# Patient Record
Sex: Male | Born: 1960 | Race: Black or African American | Hispanic: No | Marital: Single | Smoking: Former smoker
Health system: Southern US, Community
[De-identification: ages and names within clinical notes are randomized; demographics above are authoritative.]

## PROBLEM LIST (undated history)

## (undated) DIAGNOSIS — M1990 Unspecified osteoarthritis, unspecified site: Secondary | ICD-10-CM

## (undated) DIAGNOSIS — F32A Depression, unspecified: Secondary | ICD-10-CM

## (undated) DIAGNOSIS — J189 Pneumonia, unspecified organism: Secondary | ICD-10-CM

## (undated) DIAGNOSIS — F419 Anxiety disorder, unspecified: Secondary | ICD-10-CM

## (undated) DIAGNOSIS — E119 Type 2 diabetes mellitus without complications: Secondary | ICD-10-CM

## (undated) DIAGNOSIS — I10 Essential (primary) hypertension: Secondary | ICD-10-CM

## (undated) HISTORY — PX: MANDIBLE FRACTURE SURGERY: SHX706

## (undated) HISTORY — PX: HERNIA REPAIR: SHX51

## (undated) HISTORY — DX: Type 2 diabetes mellitus without complications: E11.9

## (undated) HISTORY — DX: Essential (primary) hypertension: I10

---

## 1999-12-17 ENCOUNTER — Emergency Department (HOSPITAL_COMMUNITY): Admission: EM | Admit: 1999-12-17 | Discharge: 1999-12-17 | Payer: Self-pay | Admitting: Emergency Medicine

## 1999-12-26 ENCOUNTER — Emergency Department (HOSPITAL_COMMUNITY): Admission: EM | Admit: 1999-12-26 | Discharge: 1999-12-26 | Payer: Self-pay | Admitting: Emergency Medicine

## 1999-12-26 ENCOUNTER — Encounter: Payer: Self-pay | Admitting: Emergency Medicine

## 1999-12-29 ENCOUNTER — Encounter: Admission: RE | Admit: 1999-12-29 | Discharge: 1999-12-29 | Payer: Self-pay | Admitting: Internal Medicine

## 2000-01-05 ENCOUNTER — Encounter: Admission: RE | Admit: 2000-01-05 | Discharge: 2000-01-05 | Payer: Self-pay | Admitting: Internal Medicine

## 2000-01-06 ENCOUNTER — Emergency Department (HOSPITAL_COMMUNITY): Admission: EM | Admit: 2000-01-06 | Discharge: 2000-01-06 | Payer: Self-pay | Admitting: Emergency Medicine

## 2000-01-08 ENCOUNTER — Ambulatory Visit (HOSPITAL_COMMUNITY): Admission: RE | Admit: 2000-01-08 | Discharge: 2000-01-08 | Payer: Self-pay | Admitting: *Deleted

## 2000-01-09 ENCOUNTER — Encounter: Admission: RE | Admit: 2000-01-09 | Discharge: 2000-01-09 | Payer: Self-pay | Admitting: Internal Medicine

## 2000-02-11 ENCOUNTER — Ambulatory Visit (HOSPITAL_BASED_OUTPATIENT_CLINIC_OR_DEPARTMENT_OTHER): Admission: RE | Admit: 2000-02-11 | Discharge: 2000-02-11 | Payer: Self-pay | Admitting: Surgery

## 2000-02-27 ENCOUNTER — Ambulatory Visit (HOSPITAL_COMMUNITY): Admission: RE | Admit: 2000-02-27 | Discharge: 2000-02-27 | Payer: Self-pay | Admitting: Surgery

## 2000-02-27 ENCOUNTER — Encounter: Payer: Self-pay | Admitting: Surgery

## 2000-07-08 ENCOUNTER — Ambulatory Visit (HOSPITAL_COMMUNITY): Admission: RE | Admit: 2000-07-08 | Discharge: 2000-07-08 | Payer: Self-pay | Admitting: Surgery

## 2000-07-08 ENCOUNTER — Encounter: Payer: Self-pay | Admitting: Surgery

## 2004-11-09 ENCOUNTER — Emergency Department: Payer: Self-pay | Admitting: Emergency Medicine

## 2008-10-01 ENCOUNTER — Inpatient Hospital Stay: Payer: Self-pay | Admitting: Unknown Physician Specialty

## 2010-02-26 ENCOUNTER — Inpatient Hospital Stay: Payer: Self-pay | Admitting: Psychiatry

## 2010-04-26 ENCOUNTER — Emergency Department: Payer: Self-pay | Admitting: Emergency Medicine

## 2011-03-28 ENCOUNTER — Emergency Department: Payer: Self-pay | Admitting: Emergency Medicine

## 2012-11-19 ENCOUNTER — Emergency Department: Payer: Self-pay | Admitting: Emergency Medicine

## 2012-12-27 ENCOUNTER — Emergency Department: Payer: Self-pay | Admitting: Emergency Medicine

## 2012-12-27 LAB — COMPREHENSIVE METABOLIC PANEL
Albumin: 3.4 g/dL (ref 3.4–5.0)
Alkaline Phosphatase: 83 U/L (ref 50–136)
Anion Gap: 4 — ABNORMAL LOW (ref 7–16)
BUN: 10 mg/dL (ref 7–18)
Bilirubin,Total: 0.2 mg/dL (ref 0.2–1.0)
Calcium, Total: 8.6 mg/dL (ref 8.5–10.1)
Chloride: 110 mmol/L — ABNORMAL HIGH (ref 98–107)
Co2: 27 mmol/L (ref 21–32)
Creatinine: 0.86 mg/dL (ref 0.60–1.30)
EGFR (African American): >60
EGFR (Non-African Amer.): >60
Glucose: 102 mg/dL — ABNORMAL HIGH (ref 65–99)
Osmolality: 280 (ref 275–301)
Potassium: 3.9 mmol/L (ref 3.5–5.1)
SGOT(AST): 30 U/L (ref 15–37)
SGPT (ALT): 38 U/L (ref 12–78)
Sodium: 141 mmol/L (ref 136–145)
Total Protein: 7.2 g/dL (ref 6.4–8.2)

## 2012-12-27 LAB — CBC
HCT: 39.5 % — ABNORMAL LOW (ref 40.0–52.0)
HGB: 13.1 g/dL (ref 13.0–18.0)
MCH: 29.6 pg (ref 26.0–34.0)
MCHC: 33.2 g/dL (ref 32.0–36.0)
MCV: 89 fL (ref 80–100)
Platelet: 286 10*3/uL (ref 150–440)
RBC: 4.43 10*6/uL (ref 4.40–5.90)
RDW: 15.1 % — ABNORMAL HIGH (ref 11.5–14.5)
WBC: 6.8 10*3/uL (ref 3.8–10.6)

## 2012-12-27 LAB — URINALYSIS, COMPLETE
Bacteria: NONE SEEN
Bilirubin,UR: NEGATIVE
Blood: NEGATIVE
Glucose,UR: NEGATIVE mg/dL (ref 0–75)
Ketone: NEGATIVE
Leukocyte Esterase: NEGATIVE
Nitrite: NEGATIVE
Ph: 7 (ref 4.5–8.0)
Protein: NEGATIVE
RBC,UR: 2 /HPF (ref 0–5)
Specific Gravity: 1.016 (ref 1.003–1.030)
Squamous Epithelial: NONE SEEN
WBC UR: 1 /HPF (ref 0–5)

## 2013-04-21 ENCOUNTER — Ambulatory Visit: Payer: Self-pay | Admitting: Family Medicine

## 2013-10-10 ENCOUNTER — Emergency Department: Payer: Self-pay | Admitting: Emergency Medicine

## 2016-05-11 ENCOUNTER — Emergency Department: Payer: Self-pay

## 2016-05-11 ENCOUNTER — Emergency Department
Admission: EM | Admit: 2016-05-11 | Discharge: 2016-05-11 | Discharge status: Against Medical Advice | Payer: Self-pay | Attending: Emergency Medicine | Admitting: Emergency Medicine

## 2016-05-11 ENCOUNTER — Encounter: Payer: Self-pay | Admitting: Emergency Medicine

## 2016-05-11 DIAGNOSIS — R109 Unspecified abdominal pain: Secondary | ICD-10-CM

## 2016-05-11 DIAGNOSIS — R1030 Lower abdominal pain, unspecified: Secondary | ICD-10-CM | POA: Insufficient documentation

## 2016-05-11 DIAGNOSIS — F17210 Nicotine dependence, cigarettes, uncomplicated: Secondary | ICD-10-CM | POA: Insufficient documentation

## 2016-05-11 DIAGNOSIS — R059 Cough, unspecified: Secondary | ICD-10-CM

## 2016-05-11 DIAGNOSIS — R05 Cough: Secondary | ICD-10-CM

## 2016-05-11 DIAGNOSIS — R079 Chest pain, unspecified: Secondary | ICD-10-CM | POA: Insufficient documentation

## 2016-05-11 LAB — TROPONIN I

## 2016-05-11 LAB — BASIC METABOLIC PANEL
ANION GAP: 6 (ref 5–15)
BUN: 13 mg/dL (ref 6–20)
CALCIUM: 8.7 mg/dL — AB (ref 8.9–10.3)
CO2: 25 mmol/L (ref 22–32)
CREATININE: 0.96 mg/dL (ref 0.61–1.24)
Chloride: 107 mmol/L (ref 101–111)
GFR calc Af Amer: >60 mL/min (ref >60)
GFR calc non Af Amer: >60 mL/min (ref >60)
Glucose, Bld: 109 mg/dL — ABNORMAL HIGH (ref 65–99)
Potassium: 4 mmol/L (ref 3.5–5.1)
Sodium: 138 mmol/L (ref 135–145)

## 2016-05-11 LAB — CBC
HCT: 40 % (ref 40.0–52.0)
Hemoglobin: 13.9 g/dL (ref 13.0–18.0)
MCH: 30 pg (ref 26.0–34.0)
MCHC: 34.8 g/dL (ref 32.0–36.0)
MCV: 86.2 fL (ref 80.0–100.0)
PLATELETS: 298 10*3/uL (ref 150–440)
RBC: 4.64 MIL/uL (ref 4.40–5.90)
RDW: 14.8 % — AB (ref 11.5–14.5)
WBC: 6.8 10*3/uL (ref 3.8–10.6)

## 2016-05-11 NOTE — ED Provider Notes (Signed)
[facility] Emergency Department Provider Note   ____________________________________________   First MD Initiated Contact with Patient 05/11/16 1459     (approximate)  I have reviewed the triage vital signs and the nursing notes.   HISTORY  Chief Complaint Cough; Chest Pain; and Abdominal Pain   HPI Calvin Wheeler is a 55 y.o. male  with multiple complaints including left sided chest pain as well as lower abdominal pain. Also complains of a cough.  When I began my shift the nurse asked me to probably going to the room as the patient is now wanting to leave AGAINST MEDICAL ADVICE. Per the patient's nurse he was here several weeks ago and "nothing was found." He says that he would like to go at this time because he does not feel that he is getting any useful information.  History reviewed. No pertinent past medical history.  There are no active problems to display for this patient.   Past Surgical History:  Procedure Laterality Date  . HERNIA REPAIR      Prior to Admission medications   Not on File    Allergies Review of patient's allergies indicates no known allergies.  No family history on file.  Social History Social History  Substance Use Topics  . Smoking status: Current Some Day Smoker    Packs/day: 0.50    Types: Cigarettes  . Smokeless tobacco: Never Used  . Alcohol use No    Review of Systems  Unable to perform review of systems secondary to patient wanting to leave and not willing to participate in this portion of the history.  ____________________________________________   PHYSICAL EXAM:  VITAL SIGNS: ED Triage Vitals  Enc Vitals Group     BP 05/11/16 1124 134/86     Pulse Rate 05/11/16 1124 63     Resp 05/11/16 1124 18     Temp 05/11/16 1124 98.1 F (36.7 C)     Temp Source 05/11/16 1124 Oral     SpO2 05/11/16 1124 96 %     Weight 05/11/16 1124 275 lb (124.7 kg)     Height 05/11/16 1124 6\' 2"  (1.88 m)   Head Circumference --      Peak Flow --      Pain Score 05/11/16 1138 8     Pain Loc --      Pain Edu? --      Excl. in GC? --     Constitutional: Alert and oriented. Well appearing and in no acute distress. Head: Atraumatic. Nose: No congestion/rhinnorhea. Neck: No stridor.   Respiratory: Normal respiratory effort.  Speaks in full sentences Neurologic:  Normal speech and language. No gross focal neurologic deficits are appreciated. No gait instability.  Physical exam based on gross observation secondary to patient wanted to leave and I wanted to straighten any further medical workup.  ____________________________________________   LABS (all labs ordered are listed, but only abnormal results are displayed)  Labs Reviewed  BASIC METABOLIC PANEL - Abnormal; Notable for the following:       Result Value   Glucose, Bld 109 (*)    Calcium 8.7 (*)    All other components within normal limits  CBC - Abnormal; Notable for the following:    RDW 14.8 (*)    All other components within normal limits  TROPONIN I   ____________________________________________  EKG  ED ECG REPORT I, Arelia LongestSchaevitz,  Rudy Domek M, the attending physician, personally viewed and interpreted this ECG.   Date: 05/11/2016  EKG Time: 1121  Rate: 65  Rhythm: normal sinus rhythm  Axis: Normal  Intervals:none  ST&T Change: No ST segment elevation or depression. No abnormal T-wave inversions.   ____________________________________________  RADIOLOGY  DG Chest 2 View (Accession [id number]) (Order [id number])  Imaging  Date: 05/11/2016 Department: [facility] EMERGENCY DEPARTMENT Released By: Jodelle Red, RN (auto-released) Authorizing: Jene Every, MD  PACS Images   Show images for DG Chest 2 View  Study Result   CLINICAL DATA:  Productive cough for 3 weeks  EXAM: CHEST  2 VIEW  COMPARISON:  12/27/2012  FINDINGS: Normal heart size. Lungs clear. No pneumothorax. No  pleural effusion.  IMPRESSION: No active cardiopulmonary disease.   Electronically Signed   By: Jolaine Click M.D.   On: 05/11/2016 12:30     ____________________________________________   PROCEDURES  Procedure(s) performed:   Procedures  Critical Care performed:   ____________________________________________   INITIAL IMPRESSION / ASSESSMENT AND PLAN / ED COURSE  Pertinent labs & imaging results that were available during my care of the patient were reviewed by me and considered in my medical decision making (see chart for details).   Clinical Course   Patient does not appear clinically intoxicated. He was able to ambulate without any assistance. I offered to complete his exam and evaluation but he refused. He says that he would much rather leave. Because of his refusal to continue with his care I will be discharging him AGAINST MEDICAL ADVICE. He did have a very reassuring lab workup. However, without any clinical context is very difficult to make any definitive diagnosis. I gave him follow-up with cardiology as well as surgery and primary care. He knows that he may return at any time for further workup and treatment.  ____________________________________________   FINAL CLINICAL IMPRESSION(S) / ED DIAGNOSES  Final diagnoses:  Cough  Chest pain, unspecified chest pain type  Abdominal pain, unspecified abdominal location      NEW MEDICATIONS STARTED DURING THIS VISIT:  New Prescriptions   No medications on file     Note:  This document was prepared using Dragon voice recognition software and may include unintentional dictation errors.    Myrna Blazer, MD 05/11/16 [phone]

## 2016-05-11 NOTE — ED Notes (Signed)
Patient standing at room door shouting to the nurses and doctors station "can I get my papers" I told patient I am waiting on his papers. When this RN stepped in the room patient stated "Do not tell me what I need to do. I can leave" This RN told patient "I had to wait on your papers to print. If you would like to wait and sign, I am bringing it up now"  Patient stayed to sign and stated "You do not have to worry about me coming back. If I come back it will be by ambulance"   Patient grabbed papers and threw them in the trash as he walked out

## 2016-05-11 NOTE — ED Notes (Signed)
Patient verbally aggressive when speaking to this RN. Patient stated "If you are just going to give me the run around and not tell me what is wrong then I am going to leave" This RN told patient I will speak with the doctor and see if your results are back

## 2016-05-11 NOTE — ED Triage Notes (Signed)
Pt states he has had a productive cough for about a week now, also c/o left sided cp intermittently for a week also. Has been taking cough medicine with no relief, denies heart issues, pain in chest comes while resting and while coughing, states, "the ache travels to his left shoulder as well." Pt has had a hernia in the past, states the lower abd pain has been bothering him with cough also. NAD.

## 2016-05-30 NOTE — Progress Notes (Deleted)
    Cardiology Office Note   Date:  05/30/2016   ID:  Calvin Wheeler, DOB 01/21/1961, MRN [id number]  Referring Doctor:  No PCP Per Patient   Cardiologist:   Almond LintAileen Cena Bruhn, MD   Reason for consultation:  No chief complaint on file.     History of Present Illness: Calvin Wheeler is a 55 y.o. male who presents for ***   ROS:  Please see the history of present illness. Aside from mentioned under HPI, all other systems are reviewed and negative.     No past medical history on file.  Past Surgical History:  Procedure Laterality Date  . HERNIA REPAIR       reports that he has been smoking Cigarettes.  He has been smoking about 0.50 packs per day. He has never used smokeless tobacco. He reports that he does not drink alcohol.   family history is not on file.   No outpatient prescriptions prior to visit.   No facility-administered medications prior to visit.      Allergies: Review of patient's allergies indicates no known allergies.    PHYSICAL EXAM: VS:  There were no vitals taken for this visit. , There is no height or weight on file to calculate BMI. Wt Readings from Last 3 Encounters:  05/11/16 275 lb (124.7 kg)    GENERAL:  well developed, well nourished, *** obese, not in acute distress HEENT: normocephalic, pink conjunctivae, anicteric sclerae, no xanthelasma, normal dentition, oropharynx clear NECK:  no neck vein engorgement, JVP normal, no hepatojugular reflux, carotid upstroke brisk and symmetric, no bruit, no thyromegaly, no lymphadenopathy LUNGS:  good respiratory effort, clear to auscultation bilaterally CV:  PMI not displaced, no thrills, no lifts, S1 and S2 within normal limits, no palpable S3 or S4, no murmurs, no rubs, no gallops ABD:  Soft, nontender, nondistended, normoactive bowel sounds, no abdominal aortic bruit, no hepatomegaly, no splenomegaly MS: nontender back, no kyphosis, no scoliosis, no joint deformities EXT:  2+ DP/PT pulses, no edema, no  varicosities, no cyanosis, no clubbing SKIN: warm, nondiaphoretic, normal turgor, no ulcers NEUROPSYCH: alert, oriented to person, place, and time, sensory/motor grossly intact, normal mood, appropriate affect  Recent Labs: 05/11/2016: BUN 13; Creatinine, Ser 0.96; Hemoglobin 13.9; Platelets 298; Potassium 4.0; Sodium 138   Lipid Panel No results found for: CHOL, TRIG, HDL, CHOLHDL, VLDL, LDLCALC, LDLDIRECT   Other studies Reviewed:  EKG:  The ekg from *** was personally reviewed by me and it revealed ***  Additional studies/ records that were reviewed personally reviewed by me today include: ***   ASSESSMENT AND PLAN:    Current medicines are reviewed at length with the patient today.  The patient {ACTIONS; HAS/DOES NOT HAVE:19233} concerns regarding medicines.  Labs/ tests ordered today include: No orders of the defined types were placed in this encounter.   I had a lengthy and detailed discussion with the patient regarding diagnoses, prognosis, diagnostic options, treatment options ***, and side effects of medications.   I counseled the patient on importance of lifestyle modification including heart healthy diet, regular physical activity *** , and smoking cessation.   Disposition:   FU with undersigned after tests ***   Signed, Almond LintAileen [place] Monds, MD  05/30/2016 11:58 AM    [facility]  This note was generated in part with voice recognition software and I apologize for any typographical errors that were not detected and corrected.

## 2016-06-02 ENCOUNTER — Ambulatory Visit: Payer: Self-pay | Admitting: Cardiology

## 2017-11-30 ENCOUNTER — Other Ambulatory Visit: Payer: Self-pay

## 2017-11-30 ENCOUNTER — Emergency Department
Admission: EM | Admit: 2017-11-30 | Discharge: 2017-11-30 | Disposition: A | Discharge status: Home / Self Care | Payer: Self-pay | Attending: Emergency Medicine | Admitting: Emergency Medicine

## 2017-11-30 ENCOUNTER — Emergency Department: Payer: Self-pay

## 2017-11-30 ENCOUNTER — Encounter: Payer: Self-pay | Admitting: Emergency Medicine

## 2017-11-30 DIAGNOSIS — F17210 Nicotine dependence, cigarettes, uncomplicated: Secondary | ICD-10-CM | POA: Insufficient documentation

## 2017-11-30 DIAGNOSIS — J209 Acute bronchitis, unspecified: Secondary | ICD-10-CM | POA: Insufficient documentation

## 2017-11-30 LAB — GROUP A STREP BY PCR: Group A Strep by PCR: NOT DETECTED

## 2017-11-30 LAB — INFLUENZA PANEL BY PCR (TYPE A & B)
INFLAPCR: NEGATIVE
Influenza B By PCR: NEGATIVE

## 2017-11-30 MED ORDER — AZITHROMYCIN 500 MG PO TABS: 500.0000 mg | ORAL_TABLET | Freq: Every day | ORAL | 0 refills | Status: AC

## 2017-11-30 MED ORDER — HYDROCOD POLST-CPM POLST ER 10-8 MG/5ML PO SUER
5.0000 mL | Freq: Once | ORAL | Status: AC
  Administered 2017-11-30: 5 mL via ORAL
  Filled 2017-11-30: qty 5

## 2017-11-30 MED ORDER — BENZONATATE 100 MG PO CAPS: 100.0000 mg | ORAL_CAPSULE | Freq: Three times a day (TID) | ORAL | 0 refills | Status: AC | PRN

## 2017-11-30 NOTE — ED Notes (Addendum)
Pt to the er for cough and sore throat for 2 weeks. Pt has been taking otc meds to try and help. Pt coughing up white phlegm.

## 2017-11-30 NOTE — ED Provider Notes (Signed)
[facility] Emergency Department Provider Note    First MD Initiated Contact with Patient 11/30/17 [phone]     (approximate)  I have reviewed the triage vital signs and the nursing notes.   HISTORY  Chief Complaint Sore Throat    HPI Calvin Wheeler is a 57 y.o. male with no history of any chronic medical conditions presents to the emergency department with a 2-week history of sore throat nasal congestion and nonproductive cough.  Patient denies any fever.  Patient denies multiple sick contacts with same.   Past medical history No chronic medical conditions There are no active problems to display for this patient.   Past Surgical History:  Procedure Laterality Date  . HERNIA REPAIR      Prior to Admission medications   Not on File    Allergies No known drug allergies No family history on file.  Social History Social History   Tobacco Use  . Smoking status: Current Some Day Smoker    Packs/day: 0.50    Types: Cigarettes  . Smokeless tobacco: Never Used  Substance Use Topics  . Alcohol use: No  . Drug use: Not on file    Review of Systems Constitutional: No fever/chills Eyes: No visual changes. ENT: Positive for sore throat.  Positive for nasal congestion Cardiovascular: Denies chest pain. Respiratory: Denies shortness of breath.  Positive for cough Gastrointestinal: No abdominal pain.  No nausea, no vomiting.  No diarrhea.  No constipation. Genitourinary: Negative for dysuria. Musculoskeletal: Negative for neck pain.  Negative for back pain. Integumentary: Negative for rash. Neurological: Negative for headaches, focal weakness or numbness.  ____________________________________________   PHYSICAL EXAM:  VITAL SIGNS: ED Triage Vitals  Enc Vitals Group     BP 11/30/17 0314 (!) 142/86     Pulse Rate 11/30/17 0314 90     Resp 11/30/17 0314 18     Temp 11/30/17 0314 99.8 F (37.7 C)     Temp Source 11/30/17 0314 Oral   SpO2 11/30/17 0314 97 %     Weight 11/30/17 0310 117.9 kg (260 lb)     Height 11/30/17 0310 1.88 m (6\' 2" )     Head Circumference --      Peak Flow --      Pain Score 11/30/17 0310 10     Pain Loc --      Pain Edu? --      Excl. in GC? --    Constitutional: Alert and oriented. Well appearing and in no acute distress. Eyes: Conjunctivae are normal.  Head: Atraumatic. Nose: Positive for congestion/rhinnorhea. Mouth/Throat: Mucous membranes are moist.  Oropharynx non-erythematous. Neck: No stridor.   Cardiovascular: Normal rate, regular rhythm. Good peripheral circulation. Grossly normal heart sounds. Respiratory: Normal respiratory effort.  No retractions. Lungs CTAB. Gastrointestinal: Soft and nontender. No distention.  Musculoskeletal: No lower extremity tenderness nor edema. No gross deformities of extremities. Neurologic:  Normal speech and language. No gross focal neurologic deficits are appreciated.  Skin:  Skin is warm, dry and intact. No rash noted. Psychiatric: Mood and affect are normal. Speech and behavior are normal.  ____________________________________________   LABS (all labs ordered are listed, but only abnormal results are displayed)  Labs Reviewed  GROUP A STREP BY PCR  INFLUENZA PANEL BY PCR (TYPE A & B)    RADIOLOGY I, [place] BROWN, personally viewed and evaluated these images (plain radiographs) as part of my medical decision making, as well as reviewing the written report by the  radiologist.  ED MD interpretation: No acute findings noted on chest x-ray  Official radiology report(s): Dg Chest 2 View  Result Date: 11/30/2017 CLINICAL DATA:  57 y/o  M; 2 weeks of cough and sore throat. EXAM: CHEST  2 VIEW COMPARISON:  05/11/2016 chest radiograph FINDINGS: Stable heart size and mediastinal contours are within normal limits. Both lungs are clear. The visualized skeletal structures are unremarkable. IMPRESSION: No acute pulmonary process identified.  Electronically Signed   By: Mitzi Hansen M.D.   On: 11/30/2017 03:44    Procedures   ____________________________________________   INITIAL IMPRESSION / ASSESSMENT AND PLAN / ED COURSE  As part of my medical decision making, I reviewed the following data within the electronic MEDICAL RECORD NUMBER  57 year old male presented with above-stated history and physical exam secondary to cough congestion.  Given chronicity of symptoms considered possibility of bronchitis, pneumonia influenza or strep.  Influenza PCR negative strep PCR likewise negative chest x-ray revealed no evidence of as such suspect bronchitis as the etiology for the patient's symptoms.  Patient given azithromycin in the emergency department and will be prescribed the same for home as well as Tessalon Perles.    ____________________________________________  FINAL CLINICAL IMPRESSION(S) / ED DIAGNOSES  Final diagnoses:  Acute bronchitis, unspecified organism     MEDICATIONS GIVEN DURING THIS VISIT:  Medications  chlorpheniramine-HYDROcodone (TUSSIONEX) 10-8 MG/5ML suspension 5 mL (5 mLs Oral Given 11/30/17 0345)     ED Discharge Orders    None       Note:  This document was prepared using Dragon voice recognition software and may include unintentional dictation errors.    Darci Current, MD 11/30/17 2242

## 2017-11-30 NOTE — ED Triage Notes (Signed)
Patient ambulatory to triage with steady gait, without difficulty or distress noted, mask in place; pt st x 2wks having sore throat and congestion; denies fever

## 2017-12-01 ENCOUNTER — Other Ambulatory Visit: Payer: Self-pay

## 2017-12-01 ENCOUNTER — Emergency Department
Admission: EM | Admit: 2017-12-01 | Discharge: 2017-12-01 | Disposition: A | Discharge status: Home / Self Care | Payer: Self-pay | Attending: Emergency Medicine | Admitting: Emergency Medicine

## 2017-12-01 DIAGNOSIS — F17210 Nicotine dependence, cigarettes, uncomplicated: Secondary | ICD-10-CM | POA: Insufficient documentation

## 2017-12-01 DIAGNOSIS — J40 Bronchitis, not specified as acute or chronic: Secondary | ICD-10-CM | POA: Insufficient documentation

## 2017-12-01 NOTE — ED Triage Notes (Signed)
Pt was seen here yesterday and dx with bronchitis was given antibiotics and cough med. States the cough med is making him drowsy and cannot go back to work tomorrow as scheduled. States he is here for an extension on his work note.

## 2017-12-01 NOTE — ED Notes (Signed)
Pt requesting a note for work.  Pt was seen here yesterday for bronchitis.  Pt alert.

## 2017-12-01 NOTE — ED Provider Notes (Signed)
[facility] Emergency Department Provider Note  ____________________________________________  Time seen: Approximately 9:05 PM  I have reviewed the triage vital signs and the nursing notes.   HISTORY  Chief Complaint Medication Reaction    HPI Calvin Wheeler is a 57 y.o. male who presents the emergency department requesting extension of his work note.  Patient was seen in this department yesterday and diagnosed with bronchitis and started on Tessalon Perles and Zithromax.  Patient reports that the [employer] Per[employer] worked exceptionally well for his cough but it caused him to be jittery, and drowsy.  Patient works around [employer] and is requesting another day off of work prior to returning.  At this time, patient reports that symptoms have improved somewhat with the medication but he still does not "feel myself."  Patient denies any fevers or chills, headache, visual changes, chest pain, shortness of breath, abdominal pain, nausea or vomiting.  Patient still has coughing.  No other complaints at this time.  Patient is taking his prescribed medications of Zithromax and Tessalon Perles  No past medical history on file.  There are no active problems to display for this patient.   Past Surgical History:  Procedure Laterality Date  . HERNIA REPAIR      Prior to Admission medications   Medication Sig Start Date End Date Taking? Authorizing Provider  azithromycin (ZITHROMAX) 500 MG tablet Take 1 tablet (500 mg total) by mouth daily for 3 days. Take 1 tablet daily for 3 days. 11/30/17 12/03/17  Darci CurrentBrown, Martin N, MD  benzonatate (TESSALON PERLES) 100 MG capsule Take 1 capsule (100 mg total) by mouth 3 (three) times daily as needed for up to 10 days for cough. 11/30/17 12/10/17  Darci CurrentBrown, Kake N, MD    Allergies Patient has no known allergies.  No family history on file.  Social History Social History   Tobacco Use  . Smoking status: Current Some Day Smoker   Packs/day: 0.50    Types: Cigarettes  . Smokeless tobacco: Never Used  Substance Use Topics  . Alcohol use: No  . Drug use: Not on file     Review of Systems  Constitutional: No fever/chills Eyes: No visual changes. No discharge ENT: No upper respiratory complaints. Cardiovascular: no chest pain. Respiratory: Positive cough. No SOB. Gastrointestinal: No abdominal pain.  No nausea, no vomiting.  Musculoskeletal: Negative for musculoskeletal pain. Skin: Negative for rash, abrasions, lacerations, ecchymosis. Neurological: Negative for headaches, focal weakness or numbness. 10-point ROS otherwise negative.  ____________________________________________   PHYSICAL EXAM:  VITAL SIGNS: ED Triage Vitals  Enc Vitals Group     BP 12/01/17 2053 134/84     Pulse Rate 12/01/17 2053 91     Resp 12/01/17 2053 20     Temp 12/01/17 2053 98.6 F (37 C)     Temp Source 12/01/17 2053 Oral     SpO2 12/01/17 2053 96 %     Weight 12/01/17 2054 260 lb (117.9 kg)     Height 12/01/17 2054 6\' 2"  (1.88 m)     Head Circumference --      Peak Flow --      Pain Score --      Pain Loc --      Pain Edu? --      Excl. in GC? --      Constitutional: Alert and oriented. Well appearing and in no acute distress. Eyes: Conjunctivae are normal. PERRL. EOMI. Head: Atraumatic. ENT:      Ears: EACs and  TMs unremarkable bilaterally.      Nose: No congestion/rhinnorhea.      Mouth/Throat: Mucous membranes are moist.  Oropharynx is nonerythematous and nonedematous. Neck: No stridor.    Cardiovascular: Normal rate, regular rhythm. Normal S1 and S2.  Good peripheral circulation. Respiratory: Normal respiratory effort without tachypnea or retractions. Lungs CTAB. Good air entry to the bases with no decreased or absent breath sounds. Musculoskeletal: Full range of motion to all extremities. No gross deformities appreciated. Neurologic:  Normal speech and language. No gross focal neurologic deficits are  appreciated.  Skin:  Skin is warm, dry and intact. No rash noted. Psychiatric: Mood and affect are normal. Speech and behavior are normal. Patient exhibits appropriate insight and judgement.   ____________________________________________   LABS (all labs ordered are listed, but only abnormal results are displayed)  Labs Reviewed - No data to display ____________________________________________  EKG   ____________________________________________  RADIOLOGY  Imaging from 11/30/2017 is reviewed.  No acute findings on chest x-ray.  Consistent with radiologist finding.  No results found.  ____________________________________________    PROCEDURES  Procedure(s) performed:    Procedures    Medications - No data to display   ____________________________________________   INITIAL IMPRESSION / ASSESSMENT AND PLAN / ED COURSE  Pertinent labs & imaging results that were available during my care of the patient were reviewed by me and considered in my medical decision making (see chart for details).  Review of the [place] CSRS was performed in accordance of the [facility] prior to dispensing any controlled drugs.     Patient's diagnosis is consistent with acute bronchitis.  Patient is here requesting another day off of work.  I will write one further day off work and patient will return.  At this time, patient has reached his maximum days out of work on a work note for the emergency department otherwise, patient will be in [employer]..  Patient is to continue with Zithromax and Tessalon Perles at home.  Tylenol and Motrin as needed.  Patient is to rest, drink plenty of fluids.  Patient is to follow-up with primary care.  Patient is given ED precautions to return to the ED for any worsening or new symptoms.     ____________________________________________  FINAL CLINICAL IMPRESSION(S) / ED DIAGNOSES  Final diagnoses:  Bronchitis      NEW MEDICATIONS STARTED DURING THIS VISIT:  ED  Discharge Orders    None          This chart was dictated using voice recognition software/Dragon. Despite best efforts to proofread, errors can occur which can change the meaning. Any change was purely unintentional.    Lanette Hampshire 12/01/17 2109    Phineas Semen, MD 12/01/17 [phone]

## 2018-01-24 ENCOUNTER — Emergency Department
Admission: EM | Admit: 2018-01-24 | Discharge: 2018-01-24 | Disposition: A | Discharge status: Home / Self Care | Payer: Self-pay | Attending: Emergency Medicine | Admitting: Emergency Medicine

## 2018-01-24 ENCOUNTER — Encounter: Payer: Self-pay | Admitting: Emergency Medicine

## 2018-01-24 DIAGNOSIS — N529 Male erectile dysfunction, unspecified: Secondary | ICD-10-CM | POA: Insufficient documentation

## 2018-01-24 DIAGNOSIS — A630 Anogenital (venereal) warts: Secondary | ICD-10-CM | POA: Insufficient documentation

## 2018-01-24 DIAGNOSIS — F17210 Nicotine dependence, cigarettes, uncomplicated: Secondary | ICD-10-CM | POA: Insufficient documentation

## 2018-01-24 DIAGNOSIS — R350 Frequency of micturition: Secondary | ICD-10-CM | POA: Insufficient documentation

## 2018-01-24 LAB — URINALYSIS, COMPLETE (UACMP) WITH MICROSCOPIC
BILIRUBIN URINE: NEGATIVE
GLUCOSE, UA: NEGATIVE mg/dL
Hgb urine dipstick: NEGATIVE
Ketones, ur: NEGATIVE mg/dL
Leukocytes, UA: NEGATIVE
NITRITE: NEGATIVE
PH: 5 (ref 5.0–8.0)
Protein, ur: NEGATIVE mg/dL
SPECIFIC GRAVITY, URINE: 1.016 (ref 1.005–1.030)

## 2018-01-24 LAB — GLUCOSE, CAPILLARY: Glucose-Capillary: 98 mg/dL (ref 65–99)

## 2018-01-24 NOTE — ED Triage Notes (Addendum)
Pt reports urinary frequency and incontinence during the day, reports difficulty urinating when he tries to. Reports increased thirst. Pt also reports some erectile dysfunction.

## 2018-01-24 NOTE — ED Provider Notes (Signed)
[facility] Emergency Department Provider Note   ____________________________________________   First MD Initiated Contact with Patient 01/24/18 [phone]     (approximate)  I have reviewed the triage vital signs and the nursing notes.   HISTORY  Chief Complaint Urinary Frequency    HPI Calvin Wheeler is a 57 y.o. male patient complained of urinary frequency and intermittent incontinence during the day for 2 weeks.  Patient also complained of erectile dysfunction and papular lesions distal end of the penis.  Patient denies dysuria or urethral discharge.  No palliative measure for complaint.  Patient is requesting urology consult.   History reviewed. No pertinent past medical history.  There are no active problems to display for this patient.   Past Surgical History:  Procedure Laterality Date  . HERNIA REPAIR      Prior to Admission medications   Not on File    Allergies Patient has no known allergies.  No family history on file.  Social History Social History   Tobacco Use  . Smoking status: Current Some Day Smoker    Packs/day: 0.50    Types: Cigarettes  . Smokeless tobacco: Never Used  Substance Use Topics  . Alcohol use: No  . Drug use: Not on file    Review of Systems Constitutional: No fever/chills Eyes: No visual changes. ENT: No sore throat. Cardiovascular: Denies chest pain. Respiratory: Denies shortness of breath. Gastrointestinal: No abdominal pain.  No nausea, no vomiting.  No diarrhea.  No constipation. Genitourinary: Positive urinary frequency and intermittent incontinence.  Erectile dysfunction.  Lesion on penis. Musculoskeletal: Negative for back pain. Skin: Negative for rash. Neurological: Negative for headaches, focal weakness or numbness.   ____________________________________________   PHYSICAL EXAM:  VITAL SIGNS: ED Triage Vitals [01/24/18 0724]  Enc Vitals Group     BP 133/87     Pulse Rate 62   Resp 16     Temp 98.5 F (36.9 C)     Temp Source Oral     SpO2 98 %     Weight 260 lb (117.9 kg)     Height 6\' 2"  (1.88 m)     Head Circumference      Peak Flow      Pain Score 0     Pain Loc      Pain Edu?      Excl. in GC?    Constitutional: Alert and oriented. Well appearing and in no acute distress. Hematological/Lymphatic/Immunilogical: No cervical lymphadenopathy. Cardiovascular: Normal rate, regular rhythm. Grossly normal heart sounds.  Good peripheral circulation. Respiratory: Normal respiratory effort.  No retractions. Lungs CTAB. Gastrointestinal: Soft and nontender. No distention. No abdominal bruits. No CVA tenderness. Genitourinary: Papular lesions distal penis.  No inguinal adenopathy.  No urethral drainage. Skin:  Skin is warm, dry and intact. No rash noted. Psychiatric: Mood and affect are normal. Speech and behavior are normal.  ____________________________________________   LABS (all labs ordered are listed, but only abnormal results are displayed)  Labs Reviewed  URINALYSIS, COMPLETE (UACMP) WITH MICROSCOPIC - Abnormal; Notable for the following components:      Result Value   Color, Urine YELLOW (*)    APPearance CLEAR (*)    All other components within normal limits  GLUCOSE, CAPILLARY   ____________________________________________  EKG   ____________________________________________  RADIOLOGY  ED MD interpretation:    Official radiology report(s): No results found.  ____________________________________________   PROCEDURES  Procedure(s) performed:   Procedures  Critical Care performed: No  ____________________________________________  INITIAL IMPRESSION / ASSESSMENT AND PLAN / ED COURSE  As part of my medical decision making, I reviewed the following data within the electronic MEDICAL RECORD NUMBER    Patient complained of urinary frequency and intermittent incontinence for 2 weeks.  Patient also complained a result dysfunction.   Patient has bilateral warts on shaft of penis.  Patient referred to urology for definitive evaluation and treatment of complaints.  Patient given discharge care instructions to avoid sexual activity until seen by urologist.      ____________________________________________   FINAL CLINICAL IMPRESSION(S) / ED DIAGNOSES  Final diagnoses:  Urinary frequency  Genital warts  Erectile dysfunction, unspecified erectile dysfunction type     ED Discharge Orders    None       Note:  This document was prepared using Dragon voice recognition software and may include unintentional dictation errors.    Joni ReiningSmith, Samrat Hayward K, PA-C 01/24/18 [phone]    Arnaldo NatalMalinda, Paul F, MD 01/24/18 [phone]

## 2018-02-24 ENCOUNTER — Ambulatory Visit: Payer: Self-pay | Admitting: Urology

## 2018-03-08 ENCOUNTER — Other Ambulatory Visit: Payer: Self-pay

## 2018-03-08 ENCOUNTER — Encounter: Payer: Self-pay | Admitting: Emergency Medicine

## 2018-03-08 ENCOUNTER — Emergency Department
Admission: EM | Admit: 2018-03-08 | Discharge: 2018-03-08 | Disposition: A | Discharge status: Home / Self Care | Payer: Self-pay | Attending: Emergency Medicine | Admitting: Emergency Medicine

## 2018-03-08 DIAGNOSIS — R1030 Lower abdominal pain, unspecified: Secondary | ICD-10-CM | POA: Insufficient documentation

## 2018-03-08 DIAGNOSIS — R102 Pelvic and perineal pain: Secondary | ICD-10-CM

## 2018-03-08 DIAGNOSIS — R197 Diarrhea, unspecified: Secondary | ICD-10-CM

## 2018-03-08 DIAGNOSIS — F17210 Nicotine dependence, cigarettes, uncomplicated: Secondary | ICD-10-CM | POA: Insufficient documentation

## 2018-03-08 DIAGNOSIS — R112 Nausea with vomiting, unspecified: Secondary | ICD-10-CM | POA: Insufficient documentation

## 2018-03-08 LAB — URINALYSIS, COMPLETE (UACMP) WITH MICROSCOPIC
BACTERIA UA: NONE SEEN
BILIRUBIN URINE: NEGATIVE
Glucose, UA: NEGATIVE mg/dL
HGB URINE DIPSTICK: NEGATIVE
KETONES UR: NEGATIVE mg/dL
NITRITE: NEGATIVE
PH: 6 (ref 5.0–8.0)
Protein, ur: NEGATIVE mg/dL
SPECIFIC GRAVITY, URINE: 1.018 (ref 1.005–1.030)

## 2018-03-08 LAB — COMPREHENSIVE METABOLIC PANEL
ALT: 20 U/L (ref 17–63)
AST: 27 U/L (ref 15–41)
Albumin: 3.1 g/dL — ABNORMAL LOW (ref 3.5–5.0)
Alkaline Phosphatase: 59 U/L (ref 38–126)
Anion gap: 8 (ref 5–15)
BUN: 8 mg/dL (ref 6–20)
CO2: 24 mmol/L (ref 22–32)
Calcium: 9 mg/dL (ref 8.9–10.3)
Chloride: 108 mmol/L (ref 101–111)
Creatinine, Ser: 1.04 mg/dL (ref 0.61–1.24)
Glucose, Bld: 135 mg/dL — ABNORMAL HIGH (ref 65–99)
POTASSIUM: 3.8 mmol/L (ref 3.5–5.1)
Sodium: 140 mmol/L (ref 135–145)
TOTAL PROTEIN: 6.2 g/dL — AB (ref 6.5–8.1)
Total Bilirubin: 0.2 mg/dL — ABNORMAL LOW (ref 0.3–1.2)

## 2018-03-08 LAB — CBC
HEMATOCRIT: 39.7 % — AB (ref 40.0–52.0)
Hemoglobin: 13.3 g/dL (ref 13.0–18.0)
MCH: 29.8 pg (ref 26.0–34.0)
MCHC: 33.6 g/dL (ref 32.0–36.0)
MCV: 88.8 fL (ref 80.0–100.0)
PLATELETS: 273 10*3/uL (ref 150–440)
RBC: 4.47 MIL/uL (ref 4.40–5.90)
RDW: 15.1 % — AB (ref 11.5–14.5)
WBC: 6.2 10*3/uL (ref 3.8–10.6)

## 2018-03-08 LAB — LIPASE, BLOOD: LIPASE: 26 U/L (ref 11–51)

## 2018-03-08 MED ORDER — DICYCLOMINE HCL 10 MG PO CAPS
10.0000 mg | ORAL_CAPSULE | Freq: Once | ORAL | Status: AC
  Administered 2018-03-08: 10 mg via ORAL

## 2018-03-08 MED ORDER — SODIUM CHLORIDE 0.9 % IV BOLUS: 1000.0000 mL | Freq: Once | INTRAVENOUS | Status: DC

## 2018-03-08 MED ORDER — ONDANSETRON HCL 4 MG PO TABS: 4.0000 mg | ORAL_TABLET | Freq: Once | ORAL | Status: DC

## 2018-03-08 MED ORDER — ONDANSETRON HCL 4 MG/2ML IJ SOLN: 4.0000 mg | Freq: Once | INTRAMUSCULAR | Status: DC

## 2018-03-08 MED ORDER — ONDANSETRON HCL 4 MG PO TABS: 4.0000 mg | ORAL_TABLET | Freq: Every day | ORAL | 0 refills | Status: DC | PRN

## 2018-03-08 MED ORDER — DICYCLOMINE HCL 10 MG PO CAPS
ORAL_CAPSULE | ORAL | Status: AC
  Administered 2018-03-08: 10 mg via ORAL
  Filled 2018-03-08: qty 1

## 2018-03-08 MED ORDER — ONDANSETRON 4 MG PO TBDP
4.0000 mg | ORAL_TABLET | Freq: Once | ORAL | Status: AC | PRN
  Administered 2018-03-08: 4 mg via ORAL
  Filled 2018-03-08: qty 1

## 2018-03-08 MED ORDER — DICYCLOMINE HCL 20 MG PO TABS: 20.0000 mg | ORAL_TABLET | Freq: Three times a day (TID) | ORAL | 0 refills | Status: DC | PRN

## 2018-03-08 MED ORDER — ONDANSETRON 4 MG PO TBDP
ORAL_TABLET | ORAL | Status: AC
  Administered 2018-03-08: 4 mg via ORAL
  Filled 2018-03-08: qty 1

## 2018-03-08 MED ORDER — ONDANSETRON 4 MG PO TBDP
4.0000 mg | ORAL_TABLET | Freq: Once | ORAL | Status: AC
  Administered 2018-03-08: 4 mg via ORAL

## 2018-03-08 NOTE — ED Provider Notes (Signed)
[facility] Emergency Department Provider Note  ___________________________________________   First MD Initiated Contact with Patient 03/08/18 1538     (approximate)  I have reviewed the triage vital signs and the nursing notes.   HISTORY  Chief Complaint Nausea and Emesis  HPI Calvin Wheeler is a 57 y.o. male with a history of a hernia repair was presented emergency department 2 days of nausea vomiting and diarrhea.  He states that he is also having some intermittent lower abdominal cramping.  He says that he had multiple episodes of vomiting and diarrhea yesterday and then 2 episodes of vomiting today with about 5 episodes of diarrhea today.  Does not report any recent antibiotics or hospital stays.  Denies any recent sick contacts.  Denies any recent travel.  Had a dose of Zofran in the emergency department waiting room without resolution of his symptoms.  Says he is a chronic cough, likely from smoking, without change.  Does not report fever.   History reviewed. No pertinent past medical history.  There are no active problems to display for this patient.   Past Surgical History:  Procedure Laterality Date  . HERNIA REPAIR      Prior to Admission medications   Not on File    Allergies Patient has no known allergies.  History reviewed. No pertinent family history.  Social History Social History   Tobacco Use  . Smoking status: Current Some Day Smoker    Packs/day: 0.50    Types: Cigarettes  . Smokeless tobacco: Never Used  Substance Use Topics  . Alcohol use: No  . Drug use: Not on file    Review of Systems  Constitutional: No fever/chills Eyes: No visual changes. ENT: No sore throat. Cardiovascular: Denies chest pain. Respiratory: Denies shortness of breath. Gastrointestinal:   No constipation. Genitourinary: Negative for dysuria. Musculoskeletal: Negative for back pain. Skin: Negative for rash. Neurological: Negative for  headaches, focal weakness or numbness.   ____________________________________________   PHYSICAL EXAM:  VITAL SIGNS: ED Triage Vitals  Enc Vitals Group     BP 03/08/18 1406 130/76     Pulse Rate 03/08/18 1406 60     Resp 03/08/18 1406 18     Temp 03/08/18 1406 98.4 F (36.9 C)     Temp Source 03/08/18 1406 Oral     SpO2 03/08/18 1406 97 %     Weight 03/08/18 1407 260 lb (117.9 kg)     Height 03/08/18 1407 6\' 2"  (1.88 m)     Head Circumference --      Peak Flow --      Pain Score 03/08/18 1410 0     Pain Loc --      Pain Edu? --      Excl. in GC? --     Constitutional: Alert and oriented. Well appearing and in no acute distress. Eyes: Conjunctivae are normal.  Head: Atraumatic. Nose: No congestion/rhinnorhea. Mouth/Throat: Mucous membranes are moist.  Neck: No stridor.   Cardiovascular: Normal rate, regular rhythm. Grossly normal heart sounds.   Respiratory: Normal respiratory effort.  No retractions. Lungs CTAB. Gastrointestinal: Soft and nontender. No distention.  Musculoskeletal: No lower extremity tenderness nor edema.  No joint effusions. Neurologic:  Normal speech and language. No gross focal neurologic deficits are appreciated. Skin:  Skin is warm, dry and intact. No rash noted. Psychiatric: Mood and affect are normal. Speech and behavior are normal.  ____________________________________________   LABS (all labs ordered are listed, but only abnormal  results are displayed)  Labs Reviewed  COMPREHENSIVE METABOLIC PANEL - Abnormal; Notable for the following components:      Result Value   Glucose, Bld 135 (*)    Total Protein 6.2 (*)    Albumin 3.1 (*)    Total Bilirubin 0.2 (*)    All other components within normal limits  CBC - Abnormal; Notable for the following components:   HCT 39.7 (*)    RDW 15.1 (*)    All other components within normal limits  URINALYSIS, COMPLETE (UACMP) WITH MICROSCOPIC - Abnormal; Notable for the following components:    Color, Urine YELLOW (*)    APPearance CLEAR (*)    Leukocytes, UA TRACE (*)    All other components within normal limits  URINE CULTURE  LIPASE, BLOOD   ____________________________________________  EKG   ____________________________________________  RADIOLOGY   ____________________________________________   PROCEDURES  Procedure(s) performed:   Procedures  Critical Care performed:   ____________________________________________   INITIAL IMPRESSION / ASSESSMENT AND PLAN / ED COURSE  Pertinent labs & imaging results that were available during my care of the patient were reviewed by me and considered in my medical decision making (see chart for details).  Differential diagnosis includes, but is not limited to, acute appendicitis, renal colic, testicular torsion, urinary tract infection/pyelonephritis, prostatitis,  epididymitis, diverticulitis, small bowel obstruction or ileus, colitis, abdominal aortic aneurysm, gastroenteritis, hernia, etc. As part of my medical decision making, I reviewed the following data within the electronic MEDICAL RECORD NUMBER Notes from prior ED visits  ----------------------------------------- 5:16 PM on 03/08/2018 -----------------------------------------  Patient at this time feels that the symptoms have improved.  Is tolerating p.o. fluids as well as crackers.  Will be discharged with Zofran as well as Bentyl.  Also requesting a work note.  Likely viral etiology.  Very benign abdominal exam and improved with symptomatic treatment.  No urinary complaints.  Will send urine for culture. ____________________________________________   FINAL CLINICAL IMPRESSION(S) / ED DIAGNOSES  Nausea vomiting and diarrhea.  Suprapubic abdominal pain.    NEW MEDICATIONS STARTED DURING THIS VISIT:  New Prescriptions   No medications on file     Note:  This document was prepared using Dragon voice recognition software and may include unintentional  dictation errors.     Myrna BlazerSchaevitz,  Matthew, MD 03/08/18 [phone]

## 2018-03-08 NOTE — ED Triage Notes (Signed)
Pt reports that he developed N/V/D yesterday, he thought that he was feeling better today but once he got to work, he developed the N/V/D again.

## 2018-03-08 NOTE — ED Notes (Signed)
Lab notified to add on Urine Culture. 

## 2018-03-10 LAB — URINE CULTURE

## 2018-03-11 NOTE — Progress Notes (Signed)
ED Antimicrobial Stewardship Positive Culture Follow Up   Calvin Wheeler is an 57 y.o. male who presented to [facility] on 03/08/2018 with a chief complaint of nausea, vomiting and diarrhea for ~2 days. UA not remarkable but urine culture growing >=100,000 colonies E.Coli. Patient was not sent home on any abx.    [x]  Patient discharged originally without antimicrobial agent and treatment is now indicated  New antibiotic prescription: Keflex 500mg  PO twice daily x 7 days   I called patient on both phone numbers on file--busy signal every time I called. Could not reach patient. Does not have pharmacy on file.  ED Provider: Dr. Bayard Malesandolph Brown    Chief Complaint  Patient presents with  . Nausea  . Emesis    Recent Results (from the past 720 hour(s))  Urine Culture     Status: Abnormal   Collection Time: 03/08/18  5:06 PM  Result Value Ref Range Status   Specimen Description   Final    Urine Performed at [facility], [address] [place], [place] [address]    Special Requests   Final    NONE Performed at [facility], [address] [place], [place] [address]    Culture >=100,000 COLONIES/mL ESCHERICHIA COLI (A)  Final   Report Status 03/10/2018 FINAL  Final   Organism ID, Bacteria ESCHERICHIA COLI (A)  Final      Susceptibility   Escherichia coli - MIC*    AMPICILLIN <=2 SENSITIVE Sensitive     CEFAZOLIN <=4 SENSITIVE Sensitive     CEFEPIME <=1 SENSITIVE Sensitive     CEFTAZIDIME <=1 SENSITIVE Sensitive     CEFTRIAXONE <=1 SENSITIVE Sensitive     CIPROFLOXACIN <=0.25 SENSITIVE Sensitive     GENTAMICIN <=1 SENSITIVE Sensitive     IMIPENEM <=0.25 SENSITIVE Sensitive     TRIMETH/SULFA <=20 SENSITIVE Sensitive     AMPICILLIN/SULBACTAM <=2 SENSITIVE Sensitive     PIP/TAZO <=4 SENSITIVE Sensitive     Extended ESBL NEGATIVE Sensitive     * >=100,000 COLONIES/mL ESCHERICHIA COLI     Cleopatra CedarStephanie Aahil Fredin, PharmD Pharmacy Resident  03/11/2018, 1:30 PM

## 2018-03-23 ENCOUNTER — Emergency Department
Admission: EM | Admit: 2018-03-23 | Discharge: 2018-03-23 | Disposition: A | Discharge status: Home / Self Care | Payer: Self-pay | Attending: Student in an Organized Health Care Education/Training Program | Admitting: Student in an Organized Health Care Education/Training Program

## 2018-03-23 ENCOUNTER — Encounter: Payer: Self-pay | Admitting: Emergency Medicine

## 2018-03-23 ENCOUNTER — Other Ambulatory Visit: Payer: Self-pay

## 2018-03-23 DIAGNOSIS — R1013 Epigastric pain: Secondary | ICD-10-CM | POA: Insufficient documentation

## 2018-03-23 DIAGNOSIS — F17210 Nicotine dependence, cigarettes, uncomplicated: Secondary | ICD-10-CM | POA: Insufficient documentation

## 2018-03-23 DIAGNOSIS — R112 Nausea with vomiting, unspecified: Secondary | ICD-10-CM | POA: Insufficient documentation

## 2018-03-23 LAB — URINALYSIS, COMPLETE (UACMP) WITH MICROSCOPIC
BACTERIA UA: NONE SEEN
BILIRUBIN URINE: NEGATIVE
GLUCOSE, UA: NEGATIVE mg/dL
HGB URINE DIPSTICK: NEGATIVE
KETONES UR: NEGATIVE mg/dL
LEUKOCYTES UA: NEGATIVE
NITRITE: NEGATIVE
PROTEIN: NEGATIVE mg/dL
Specific Gravity, Urine: 1.025 (ref 1.005–1.030)
Squamous Epithelial / LPF: NONE SEEN (ref 0–5)
pH: 5 (ref 5.0–8.0)

## 2018-03-23 LAB — CBC
HCT: 41.2 % (ref 40.0–52.0)
HEMOGLOBIN: 13.8 g/dL (ref 13.0–18.0)
MCH: 29.7 pg (ref 26.0–34.0)
MCHC: 33.5 g/dL (ref 32.0–36.0)
MCV: 88.7 fL (ref 80.0–100.0)
Platelets: 267 10*3/uL (ref 150–440)
RBC: 4.64 MIL/uL (ref 4.40–5.90)
RDW: 15.1 % — ABNORMAL HIGH (ref 11.5–14.5)
WBC: 7 10*3/uL (ref 3.8–10.6)

## 2018-03-23 LAB — COMPREHENSIVE METABOLIC PANEL
ALBUMIN: 3.8 g/dL (ref 3.5–5.0)
ALK PHOS: 69 U/L (ref 38–126)
ALT: 18 U/L (ref 17–63)
ANION GAP: 8 (ref 5–15)
AST: 21 U/L (ref 15–41)
BILIRUBIN TOTAL: 0.5 mg/dL (ref 0.3–1.2)
BUN: 12 mg/dL (ref 6–20)
CALCIUM: 8.8 mg/dL — AB (ref 8.9–10.3)
CO2: 24 mmol/L (ref 22–32)
Chloride: 105 mmol/L (ref 101–111)
Creatinine, Ser: 1.03 mg/dL (ref 0.61–1.24)
GFR calc non Af Amer: >60 mL/min (ref >60)
GLUCOSE: 139 mg/dL — AB (ref 65–99)
POTASSIUM: 3.9 mmol/L (ref 3.5–5.1)
SODIUM: 137 mmol/L (ref 135–145)
TOTAL PROTEIN: 7.5 g/dL (ref 6.5–8.1)

## 2018-03-23 LAB — LIPASE, BLOOD: Lipase: 36 U/L (ref 11–51)

## 2018-03-23 MED ORDER — PANTOPRAZOLE SODIUM 40 MG PO TBEC: 40.0000 mg | DELAYED_RELEASE_TABLET | Freq: Every day | ORAL | 1 refills | Status: DC

## 2018-03-23 MED ORDER — GI COCKTAIL ~~LOC~~
30.0000 mL | Freq: Once | ORAL | Status: AC
  Administered 2018-03-23: 30 mL via ORAL

## 2018-03-23 MED ORDER — METOCLOPRAMIDE HCL 10 MG PO TABS: 10.0000 mg | ORAL_TABLET | Freq: Four times a day (QID) | ORAL | 0 refills | Status: DC | PRN

## 2018-03-23 MED ORDER — GI COCKTAIL ~~LOC~~
ORAL | Status: AC
  Filled 2018-03-23: qty 30

## 2018-03-23 MED ORDER — METOCLOPRAMIDE HCL 10 MG PO TABS
10.0000 mg | ORAL_TABLET | Freq: Once | ORAL | Status: AC
  Administered 2018-03-23: 10 mg via ORAL

## 2018-03-23 MED ORDER — METOCLOPRAMIDE HCL 10 MG PO TABS
ORAL_TABLET | ORAL | Status: AC
  Filled 2018-03-23: qty 1

## 2018-03-23 NOTE — Discharge Instructions (Signed)

## 2018-03-23 NOTE — ED Provider Notes (Signed)
[facility] Emergency Department Provider Note    First MD Initiated Contact with Patient 03/23/18 1356     (approximate)  I have reviewed the triage vital signs and the nursing notes.   HISTORY  Chief Complaint Abdominal Pain and Emesis    HPI Calvin Wheeler is a 57 y.o. male presents to the ER with over 1 month of intermittent nausea vomiting epigastric discomfort.  States he frequently feels a "gurgling" in his stomach.  Feels that he has had decreased oral intake.  Is concerned that his been losing weight.  His primary due to decreased appetite.  Patient currently drinking Sprite.  States that he is still audible any sandwiches and simply has not had a desire for those which is concerning him.  Does still smoke.  Denies any chest pain or shortness of breath.  No fevers.  No melena.  No hematochezia.  No hematemesis.  Had one episode of nausea with vomiting today.  Is requesting a work note.    History reviewed. No pertinent past medical history. History reviewed. No pertinent family history. Past Surgical History:  Procedure Laterality Date  . HERNIA REPAIR     There are no active problems to display for this patient.     Prior to Admission medications   Medication Sig Start Date End Date Taking? Authorizing Provider  dicyclomine (BENTYL) 20 MG tablet Take 1 tablet (20 mg total) by mouth 3 (three) times daily as needed for spasms. 03/08/18 03/08/19  Myrna Blazer, MD  metoCLOPramide (REGLAN) 10 MG tablet Take 1 tablet (10 mg total) by mouth every 6 (six) hours as needed for nausea. 03/23/18 03/23/19  Willy Eddy, MD  ondansetron (ZOFRAN) 4 MG tablet Take 1 tablet (4 mg total) by mouth daily as needed. 03/08/18   Myrna Blazer, MD  pantoprazole (PROTONIX) 40 MG tablet Take 1 tablet (40 mg total) by mouth daily. 03/23/18 03/23/19  Willy Eddy, MD    Allergies Patient has no known allergies.    Social History Social  History   Tobacco Use  . Smoking status: Current Some Day Smoker    Packs/day: 0.50    Types: Cigarettes  . Smokeless tobacco: Never Used  Substance Use Topics  . Alcohol use: No  . Drug use: Not on file    Review of Systems Patient denies headaches, rhinorrhea, blurry vision, numbness, shortness of breath, chest pain, edema, cough, abdominal pain, nausea, vomiting, diarrhea, dysuria, fevers, rashes or hallucinations unless otherwise stated above in HPI. ____________________________________________   PHYSICAL EXAM:  VITAL SIGNS: Vitals:   03/23/18 1247 03/23/18 1248  BP:  123/78  Pulse: (!) 55   Resp: 20   Temp: 98.3 F (36.8 C)   SpO2: 96%     Constitutional: Alert and oriented.  Eyes: Conjunctivae are normal.  Head: Atraumatic. Nose: No congestion/rhinnorhea. Mouth/Throat: Mucous membranes are moist.   Neck: No stridor. Painless ROM.  Cardiovascular: Normal rate, regular rhythm. Grossly normal heart sounds.  Good peripheral circulation. Respiratory: Normal respiratory effort.  No retractions. Lungs CTAB. Gastrointestinal: Soft and nontender in all four quadrants. No distention. No abdominal bruits. No CVA tenderness. Genitourinary:  Musculoskeletal: No lower extremity tenderness nor edema.  No joint effusions. Neurologic:  Normal speech and language. No gross focal neurologic deficits are appreciated. No facial droop Skin:  Skin is warm, dry and intact. No rash noted. Psychiatric: Mood and affect are normal. Speech and behavior are normal.  ____________________________________________   LABS (all labs ordered  are listed, but only abnormal results are displayed)  Results for orders placed or performed during the hospital encounter of 03/23/18 (from the past 24 hour(s))  Lipase, blood     Status: None   Collection Time: 03/23/18 12:55 PM  Result Value Ref Range   Lipase 36 11 - 51 U/L  Comprehensive metabolic panel     Status: Abnormal   Collection Time:  03/23/18 12:55 PM  Result Value Ref Range   Sodium 137 135 - 145 mmol/L   Potassium 3.9 3.5 - 5.1 mmol/L   Chloride 105 101 - 111 mmol/L   CO2 24 22 - 32 mmol/L   Glucose, Bld 139 (H) 65 - 99 mg/dL   BUN 12 6 - 20 mg/dL   Creatinine, Ser 1.61 0.61 - 1.24 mg/dL   Calcium 8.8 (L) 8.9 - 10.3 mg/dL   Total Protein 7.5 6.5 - 8.1 g/dL   Albumin 3.8 3.5 - 5.0 g/dL   AST 21 15 - 41 U/L   ALT 18 17 - 63 U/L   Alkaline Phosphatase 69 38 - 126 U/L   Total Bilirubin 0.5 0.3 - 1.2 mg/dL   GFR calc non Af Amer >60 >60 mL/min   GFR calc Af Amer >60 >60 mL/min   Anion gap 8 5 - 15  CBC     Status: Abnormal   Collection Time: 03/23/18 12:55 PM  Result Value Ref Range   WBC 7.0 3.8 - 10.6 K/uL   RBC 4.64 4.40 - 5.90 MIL/uL   Hemoglobin 13.8 13.0 - 18.0 g/dL   HCT 09.6 04.5 - 40.9 %   MCV 88.7 80.0 - 100.0 fL   MCH 29.7 26.0 - 34.0 pg   MCHC 33.5 32.0 - 36.0 g/dL   RDW 81.1 (H) 91.4 - 78.2 %   Platelets 267 150 - 440 K/uL  Urinalysis, Complete w Microscopic     Status: Abnormal   Collection Time: 03/23/18 12:55 PM  Result Value Ref Range   Color, Urine YELLOW (A) YELLOW   APPearance CLEAR (A) CLEAR   Specific Gravity, Urine 1.025 1.005 - 1.030   pH 5.0 5.0 - 8.0   Glucose, UA NEGATIVE NEGATIVE mg/dL   Hgb urine dipstick NEGATIVE NEGATIVE   Bilirubin Urine NEGATIVE NEGATIVE   Ketones, ur NEGATIVE NEGATIVE mg/dL   Protein, ur NEGATIVE NEGATIVE mg/dL   Nitrite NEGATIVE NEGATIVE   Leukocytes, UA NEGATIVE NEGATIVE   RBC / HPF 0-5 0 - 5 RBC/hpf   WBC, UA 0-5 0 - 5 WBC/hpf   Bacteria, UA NONE SEEN NONE SEEN   Squamous Epithelial / LPF NONE SEEN 0 - 5   Mucus PRESENT    ____________________________________________ ____________________________________________   PROCEDURES  Procedure(s) performed:  Procedures    Critical Care performed: no ____________________________________________   INITIAL IMPRESSION / ASSESSMENT AND PLAN / ED COURSE  Pertinent labs & imaging results that  were available during my care of the patient were reviewed by me and considered in my medical decision making (see chart for details).   DDX: Enteritis, gastritis, SBO, cholelithiasis, dehydration, PUD  Calvin Wheeler is a 57 y.o. who presents to the ED with was as described above.  Ongoing for over 1 month.  Patient nontoxic-appearing.  He is afebrile vital signs are stable.  His blood work sent for the above differential is reassuring.  No evidence of blood loss or anemia.  No evidence of AKI or metabolic derangement.  He is tolerating oral hydration.  Symptoms seem somewhat  concerning for gastritis or PUD.  We will start empirically on PPI as well as nausea medication.  Will give work note and of encouraged patient follow-up with PCP.  Will be given referral to GI.  Patient demonstrated understanding of signs and symptoms for which he should seek more urgent evaluation.  At this point I do believe he stable for outpatient treatment.      As part of my medical decision making, I reviewed the following data within the electronic MEDICAL RECORD NUMBER Nursing notes reviewed and incorporated, Labs reviewed, notes from prior ED visits.   ____________________________________________   FINAL CLINICAL IMPRESSION(S) / ED DIAGNOSES  Final diagnoses:  Epigastric pain  Non-intractable vomiting with nausea, unspecified vomiting type      NEW MEDICATIONS STARTED DURING THIS VISIT:  New Prescriptions   METOCLOPRAMIDE (REGLAN) 10 MG TABLET    Take 1 tablet (10 mg total) by mouth every 6 (six) hours as needed for nausea.   PANTOPRAZOLE (PROTONIX) 40 MG TABLET    Take 1 tablet (40 mg total) by mouth daily.     Note:  This document was prepared using Dragon voice recognition software and may include unintentional dictation errors.    Willy Eddyobinson, Dorean Hiebert, MD 03/23/18 1421

## 2018-03-23 NOTE — ED Triage Notes (Signed)
Pt reports that he has abd pain for the beginning of the month. Thought that he was getting better, but abd pain and loss of appetite is getting worse. He states that he is losing weight and is concerned.

## 2018-04-20 ENCOUNTER — Ambulatory Visit: Payer: Self-pay | Admitting: Urology

## 2018-06-16 ENCOUNTER — Other Ambulatory Visit: Payer: Self-pay

## 2018-06-16 ENCOUNTER — Emergency Department
Admission: EM | Admit: 2018-06-16 | Discharge: 2018-06-16 | Disposition: A | Discharge status: Home / Self Care | Payer: 59 | Attending: Emergency Medicine | Admitting: Emergency Medicine

## 2018-06-16 ENCOUNTER — Encounter: Payer: Self-pay | Admitting: Emergency Medicine

## 2018-06-16 DIAGNOSIS — X58XXXA Exposure to other specified factors, initial encounter: Secondary | ICD-10-CM | POA: Diagnosis not present

## 2018-06-16 DIAGNOSIS — Z79899 Other long term (current) drug therapy: Secondary | ICD-10-CM | POA: Insufficient documentation

## 2018-06-16 DIAGNOSIS — Y9389 Activity, other specified: Secondary | ICD-10-CM | POA: Insufficient documentation

## 2018-06-16 DIAGNOSIS — F17210 Nicotine dependence, cigarettes, uncomplicated: Secondary | ICD-10-CM | POA: Diagnosis not present

## 2018-06-16 DIAGNOSIS — Y929 Unspecified place or not applicable: Secondary | ICD-10-CM | POA: Insufficient documentation

## 2018-06-16 DIAGNOSIS — Y999 Unspecified external cause status: Secondary | ICD-10-CM | POA: Diagnosis not present

## 2018-06-16 DIAGNOSIS — S0501XA Injury of conjunctiva and corneal abrasion without foreign body, right eye, initial encounter: Secondary | ICD-10-CM | POA: Insufficient documentation

## 2018-06-16 DIAGNOSIS — S0591XA Unspecified injury of right eye and orbit, initial encounter: Secondary | ICD-10-CM | POA: Diagnosis present

## 2018-06-16 MED ORDER — EYE WASH OPHTH SOLN
OPHTHALMIC | Status: AC
  Administered 2018-06-16: 1 [drp] via OPHTHALMIC
  Filled 2018-06-16: qty 118

## 2018-06-16 MED ORDER — TETRACAINE HCL 0.5 % OP SOLN
OPHTHALMIC | Status: AC
  Administered 2018-06-16: 1 [drp] via OPHTHALMIC
  Filled 2018-06-16: qty 4

## 2018-06-16 MED ORDER — FLUORESCEIN SODIUM 1 MG OP STRP
ORAL_STRIP | OPHTHALMIC | Status: AC
  Filled 2018-06-16: qty 1

## 2018-06-16 MED ORDER — EYE WASH OPHTH SOLN
1.0000 [drp] | OPHTHALMIC | Status: DC | PRN
  Administered 2018-06-16: 1 [drp] via OPHTHALMIC

## 2018-06-16 MED ORDER — GENTAMICIN SULFATE 0.3 % OP SOLN: 1.0000 [drp] | OPHTHALMIC | 0 refills | Status: DC

## 2018-06-16 MED ORDER — TETRACAINE HCL 0.5 % OP SOLN
1.0000 [drp] | Freq: Once | OPHTHALMIC | Status: AC
  Administered 2018-06-16: 1 [drp] via OPHTHALMIC

## 2018-06-16 MED ORDER — FLUORESCEIN SODIUM 1 MG OP STRP: 1.0000 | ORAL_STRIP | Freq: Once | OPHTHALMIC | Status: DC

## 2018-06-16 NOTE — ED Notes (Signed)
See triage note  Presents with right eye pain   States he thinks he may have gotten something in eye yesterday while weed eating

## 2018-06-16 NOTE — ED Provider Notes (Signed)
[facility] Emergency Department Provider Note   ____________________________________________   None    (approximate)  I have reviewed the triage vital signs and the nursing notes.   HISTORY  Chief Complaint Eye Problem    HPI Calvin Wheeler is a 57 y.o. male patient presents with right eye pain secondary to suspected foreign body while reading yesterday.  Patient state he is photophobic.  Patient rates pain 10/10.  Patient described pain is "achy".  No palates measured for complaint.  Patient denies other visual disturbance.  History reviewed. No pertinent past medical history.  There are no active problems to display for this patient.   Past Surgical History:  Procedure Laterality Date  . HERNIA REPAIR      Prior to Admission medications   Medication Sig Start Date End Date Taking? Authorizing Provider  dicyclomine (BENTYL) 20 MG tablet Take 1 tablet (20 mg total) by mouth 3 (three) times daily as needed for spasms. 03/08/18 03/08/19  Schaevitz, Myra Rude, MD  gentamicin (GARAMYCIN) 0.3 % ophthalmic solution Place 1 drop into the right eye every 4 (four) hours. 06/16/18   Joni Reining, PA-C  metoCLOPramide (REGLAN) 10 MG tablet Take 1 tablet (10 mg total) by mouth every 6 (six) hours as needed for nausea. 03/23/18 03/23/19  Willy Eddy, MD  ondansetron (ZOFRAN) 4 MG tablet Take 1 tablet (4 mg total) by mouth daily as needed. 03/08/18   Myrna Blazer, MD  pantoprazole (PROTONIX) 40 MG tablet Take 1 tablet (40 mg total) by mouth daily. 03/23/18 03/23/19  Willy Eddy, MD    Allergies Patient has no known allergies.  No family history on file.  Social History Social History   Tobacco Use  . Smoking status: Current Some Day Smoker    Packs/day: 0.50    Types: Cigarettes  . Smokeless tobacco: Never Used  Substance Use Topics  . Alcohol use: No  . Drug use: Not on file    Review of Systems Constitutional: No  fever/chills Eyes: No visual changes.  Foreign body sensation right eye ENT: No sore throat. Cardiovascular: Denies chest pain. Respiratory: Denies shortness of breath. Gastrointestinal: No abdominal pain.  No nausea, no vomiting.  No diarrhea.  No constipation. Genitourinary: Negative for dysuria. Musculoskeletal: Negative for back pain. Skin: Negative for rash. Neurological: Negative for headaches, focal weakness or numbness.   ____________________________________________   PHYSICAL EXAM:  VITAL SIGNS: ED Triage Vitals  Enc Vitals Group     BP 06/16/18 0642 125/80     Pulse Rate 06/16/18 0642 73     Resp 06/16/18 0642 18     Temp 06/16/18 0642 98 F (36.7 C)     Temp Source 06/16/18 0642 Oral     SpO2 06/16/18 0642 96 %     Weight 06/16/18 0637 250 lb (113.4 kg)     Height 06/16/18 0637 6\' 2"  (1.88 m)     Head Circumference --      Peak Flow --      Pain Score 06/16/18 0637 10     Pain Loc --      Pain Edu? --      Excl. in GC? --    Constitutional: Alert and oriented. Well appearing and in no acute distress. Eyes: Conjunctivae are normal. PERRL. EOMI. fluoroscopy stain reveal a corneal abrasion.   Cardiovascular: Normal rate, regular rhythm. Grossly normal heart sounds.  Good peripheral circulation. Respiratory: Normal respiratory effort.  No retractions. Lungs CTAB. Skin:  Skin is warm, dry and intact. No rash noted. Psychiatric: Mood and affect are normal. Speech and behavior are normal.  ____________________________________________   LABS (all labs ordered are listed, but only abnormal results are displayed)  Labs Reviewed - No data to display ____________________________________________  EKG   ____________________________________________  RADIOLOGY  ED MD interpretation:    Official radiology report(s): No results found.  ____________________________________________   PROCEDURES  Procedure(s) performed: None  Procedures  Critical Care  performed: No  ____________________________________________   INITIAL IMPRESSION / ASSESSMENT AND PLAN / ED COURSE  As part of my medical decision making, I reviewed the following data within the electronic MEDICAL RECORD NUMBER    Right eye pain secondary corneal abrasion.  Patient given discharge care instruction work excuse.  Patient advised use eyedrops as directed.  Patient advised to follow-up ophthalmology if no improvement in 2 days.      ____________________________________________   FINAL CLINICAL IMPRESSION(S) / ED DIAGNOSES  Final diagnoses:  Abrasion of right cornea, initial encounter     ED Discharge Orders         Ordered    gentamicin (GARAMYCIN) 0.3 % ophthalmic solution  Every 4 hours     06/16/18 0714           Note:  This document was prepared using Dragon voice recognition software and may include unintentional dictation errors.    Joni ReiningSmith, Harley Mccartney K, PA-C 06/16/18 0720    Arnaldo NatalMalinda, Paul F, MD 06/18/18 [phone]

## 2018-06-16 NOTE — ED Notes (Signed)
Visual Acuity (w/ pt wearing r/x'd corrective lenses): Bilateral 20/40 Right 20/40 Left 20/40

## 2018-06-16 NOTE — ED Triage Notes (Signed)
Patient ambulatory to triage with steady gait, without difficulty or distress noted; pt reports right eye pain since yesterday while weed eating

## 2018-08-22 ENCOUNTER — Emergency Department
Admission: EM | Admit: 2018-08-22 | Discharge: 2018-08-22 | Disposition: A | Discharge status: Home / Self Care | Payer: 59 | Attending: Emergency Medicine | Admitting: Emergency Medicine

## 2018-08-22 ENCOUNTER — Encounter: Payer: Self-pay | Admitting: Emergency Medicine

## 2018-08-22 DIAGNOSIS — Z79899 Other long term (current) drug therapy: Secondary | ICD-10-CM | POA: Insufficient documentation

## 2018-08-22 DIAGNOSIS — K5900 Constipation, unspecified: Secondary | ICD-10-CM | POA: Diagnosis not present

## 2018-08-22 DIAGNOSIS — J0100 Acute maxillary sinusitis, unspecified: Secondary | ICD-10-CM

## 2018-08-22 DIAGNOSIS — F17210 Nicotine dependence, cigarettes, uncomplicated: Secondary | ICD-10-CM | POA: Diagnosis not present

## 2018-08-22 DIAGNOSIS — R0981 Nasal congestion: Secondary | ICD-10-CM | POA: Diagnosis present

## 2018-08-22 DIAGNOSIS — K640 First degree hemorrhoids: Secondary | ICD-10-CM | POA: Diagnosis not present

## 2018-08-22 MED ORDER — DOCUSATE SODIUM 100 MG PO CAPS: 100.0000 mg | ORAL_CAPSULE | Freq: Two times a day (BID) | ORAL | 0 refills | Status: DC

## 2018-08-22 MED ORDER — FEXOFENADINE-PSEUDOEPHED ER 60-120 MG PO TB12: 1.0000 | ORAL_TABLET | Freq: Two times a day (BID) | ORAL | 0 refills | Status: DC

## 2018-08-22 MED ORDER — AMOXICILLIN 500 MG PO CAPS: 500.0000 mg | ORAL_CAPSULE | Freq: Three times a day (TID) | ORAL | 0 refills | Status: DC

## 2018-08-22 MED ORDER — HYDROCORTISONE 2.5 % RE CREA: TOPICAL_CREAM | RECTAL | 1 refills | Status: DC

## 2018-08-22 NOTE — ED Provider Notes (Signed)
[facility] Emergency Department Provider Note   ____________________________________________   First MD Initiated Contact with Patient 08/22/18 [phone]     (approximate)  I have reviewed the triage vital signs and the nursing notes.   HISTORY  Chief Complaint Nasal Congestion and Rectal Pain    HPI Calvin Wheeler is a 57 y.o. male patient complain of sinus congestion, postnasal drainage, and chest congestion for 1 month.  Patient fever, nausea, or vomiting.  Patient also complained of spots of blood on tissue paper with bowel movement this morning.  Patient state in the past 2 weeks he has had constipation.  No relief for his cough or hemorrhoids over-the-counter preparations.  History reviewed. No pertinent past medical history.  There are no active problems to display for this patient.   Past Surgical History:  Procedure Laterality Date  . HERNIA REPAIR      Prior to Admission medications   Medication Sig Start Date End Date Taking? Authorizing Provider  amoxicillin (AMOXIL) 500 MG capsule Take 1 capsule (500 mg total) by mouth 3 (three) times daily. 08/22/18   Joni Reining, PA-C  dicyclomine (BENTYL) 20 MG tablet Take 1 tablet (20 mg total) by mouth 3 (three) times daily as needed for spasms. 03/08/18 03/08/19  Schaevitz, Myra Rude, MD  docusate sodium (COLACE) 100 MG capsule Take 1 capsule (100 mg total) by mouth 2 (two) times daily. 08/22/18 08/22/19  Joni Reining, PA-C  fexofenadine-pseudoephedrine (ALLEGRA-D) 60-120 MG 12 hr tablet Take 1 tablet by mouth 2 (two) times daily. 08/22/18   Joni Reining, PA-C  gentamicin (GARAMYCIN) 0.3 % ophthalmic solution Place 1 drop into the right eye every 4 (four) hours. 06/16/18   Joni Reining, PA-C  hydrocortisone (ANUSOL-HC) 2.5 % rectal cream Apply rectally 2 times daily 08/22/18 08/22/19  Joni Reining, PA-C  metoCLOPramide (REGLAN) 10 MG tablet Take 1 tablet (10 mg total) by mouth every 6  (six) hours as needed for nausea. 03/23/18 03/23/19  Willy Eddy, MD  ondansetron (ZOFRAN) 4 MG tablet Take 1 tablet (4 mg total) by mouth daily as needed. 03/08/18   Myrna Blazer, MD  pantoprazole (PROTONIX) 40 MG tablet Take 1 tablet (40 mg total) by mouth daily. 03/23/18 03/23/19  Willy Eddy, MD    Allergies Patient has no known allergies.  No family history on file.  Social History Social History   Tobacco Use  . Smoking status: Current Some Day Smoker    Packs/day: 0.50    Types: Cigarettes  . Smokeless tobacco: Never Used  Substance Use Topics  . Alcohol use: No  . Drug use: Never    Review of Systems Constitutional: No fever/chills Eyes: No visual changes. ENT: No sore throat. Cardiovascular: Denies chest pain. Respiratory: Denies shortness of breath. Gastrointestinal: No abdominal pain.  No nausea, no vomiting.  No diarrhea.  Constipation.   Genitourinary: Negative for dysuria. Musculoskeletal: Negative for back pain. Skin: Negative for rash. Neurological: Negative for headaches, focal weakness or numbness.   ____________________________________________   PHYSICAL EXAM:  VITAL SIGNS: ED Triage Vitals [08/22/18 0642]  Enc Vitals Group     BP 110/77     Pulse Rate 90     Resp 18     Temp 98.2 F (36.8 C)     Temp Source Oral     SpO2 96 %     Weight 272 lb (123.4 kg)     Height 6\' 2"  (1.88 m)  Head Circumference      Peak Flow      Pain Score 8     Pain Loc      Pain Edu?      Excl. in GC?    Constitutional: Alert and oriented. Well appearing and in no acute distress. Nose: Bilateral maxillary guarding with edematous nasal turbinates. Mouth/Throat: Mucous membranes are moist.  Oropharynx non-erythematous.  Postnasal drainage. Neck: No stridor.  Hematological/Lymphatic/Immunilogical: No cervical lymphadenopathy. Cardiovascular: Normal rate, regular rhythm. Grossly normal heart sounds.  Good peripheral  circulation. Respiratory: Normal respiratory effort.  No retractions. Lungs CTAB. Gastrointestinal: Soft and nontender. No distention. No abdominal bruits. No CVA tenderness.  Patient was guaiac negative. Musculoskeletal: No lower extremity tenderness nor edema.  No joint effusions. Neurologic:  Normal speech and language. No gross focal neurologic deficits are appreciated. No gait instability. Skin:  Skin is warm, dry and intact. No rash noted. Psychiatric: Mood and affect are normal. Speech and behavior are normal.  ____________________________________________   LABS (all labs ordered are listed, but only abnormal results are displayed)  Labs Reviewed - No data to display ____________________________________________  EKG   ____________________________________________  RADIOLOGY  ED MD interpretation:    Official radiology report(s): No results found.  ____________________________________________   PROCEDURES  Procedure(s) performed: None  Procedures  Critical Care performed: No  ____________________________________________   INITIAL IMPRESSION / ASSESSMENT AND PLAN / ED COURSE  As part of my medical decision making, I reviewed the following data within the electronic MEDICAL RECORD NUMBER   P patient presents with 1 month of sinus congestion and postnasal drainage.  Exam was consistent with subacute maxillary sinusitis.  Patient complain of hemorrhoids and rectal bleeding that was not verified on rectal exam.  Patient given discharge care instruction advised take medication as directed.  Patient [facility] if condition persist.   ____________________________________________   FINAL CLINICAL IMPRESSION(S) / ED DIAGNOSES  Final diagnoses:  Subacute maxillary sinusitis  Grade I hemorrhoids  Constipation, unspecified constipation type     ED Discharge Orders         Ordered    amoxicillin (AMOXIL) 500 MG capsule  3 times  daily     08/22/18 0727    fexofenadine-pseudoephedrine (ALLEGRA-D) 60-120 MG 12 hr tablet  2 times daily     08/22/18 0727    hydrocortisone (ANUSOL-HC) 2.5 % rectal cream     08/22/18 0727    docusate sodium (COLACE) 100 MG capsule  2 times daily     08/22/18 [id number]           Note:  This document was prepared using Dragon voice recognition software and may include unintentional dictation errors.    Joni ReiningSmith, Ryhanna Dunsmore K, PA-C 08/22/18 [id number]    Rockne MenghiniNorman, Anne-Caroline, MD 08/22/18 1515

## 2018-08-22 NOTE — ED Triage Notes (Signed)
Patient with complaint of chest congestion times one week. Patient also with complaint of hemorrhoid pain times one month. Patient states that when he had a BM this morning he had some blood on the tissue.

## 2018-08-22 NOTE — ED Notes (Signed)
See triage note  Presents with chest congestion and occasional cough  No fever.  States he has also had some rectal pain with some bleeding on tissue this am

## 2018-09-17 ENCOUNTER — Emergency Department
Admission: EM | Admit: 2018-09-17 | Discharge: 2018-09-17 | Disposition: A | Discharge status: Home / Self Care | Payer: Commercial Managed Care - HMO | Attending: Emergency Medicine | Admitting: Emergency Medicine

## 2018-09-17 ENCOUNTER — Emergency Department: Payer: Commercial Managed Care - HMO

## 2018-09-17 ENCOUNTER — Other Ambulatory Visit: Payer: Self-pay

## 2018-09-17 DIAGNOSIS — Z79899 Other long term (current) drug therapy: Secondary | ICD-10-CM | POA: Diagnosis not present

## 2018-09-17 DIAGNOSIS — R1032 Left lower quadrant pain: Secondary | ICD-10-CM | POA: Diagnosis present

## 2018-09-17 DIAGNOSIS — R112 Nausea with vomiting, unspecified: Secondary | ICD-10-CM

## 2018-09-17 DIAGNOSIS — F17210 Nicotine dependence, cigarettes, uncomplicated: Secondary | ICD-10-CM | POA: Diagnosis not present

## 2018-09-17 DIAGNOSIS — K4090 Unilateral inguinal hernia, without obstruction or gangrene, not specified as recurrent: Secondary | ICD-10-CM | POA: Insufficient documentation

## 2018-09-17 DIAGNOSIS — R197 Diarrhea, unspecified: Secondary | ICD-10-CM

## 2018-09-17 DIAGNOSIS — A084 Viral intestinal infection, unspecified: Secondary | ICD-10-CM

## 2018-09-17 LAB — CBC
HEMATOCRIT: 44.1 % (ref 39.0–52.0)
HEMOGLOBIN: 14.2 g/dL (ref 13.0–17.0)
MCH: 29.3 pg (ref 26.0–34.0)
MCHC: 32.2 g/dL (ref 30.0–36.0)
MCV: 90.9 fL (ref 80.0–100.0)
NRBC: 0 % (ref 0.0–0.2)
Platelets: 273 10*3/uL (ref 150–400)
RBC: 4.85 MIL/uL (ref 4.22–5.81)
RDW: 14.4 % (ref 11.5–15.5)
WBC: 7.8 10*3/uL (ref 4.0–10.5)

## 2018-09-17 LAB — COMPREHENSIVE METABOLIC PANEL
ALT: 22 U/L (ref 0–44)
AST: 42 U/L — ABNORMAL HIGH (ref 15–41)
Albumin: 3.5 g/dL (ref 3.5–5.0)
Alkaline Phosphatase: 55 U/L (ref 38–126)
Anion gap: 7 (ref 5–15)
BUN: 14 mg/dL (ref 6–20)
CHLORIDE: 108 mmol/L (ref 98–111)
CO2: 22 mmol/L (ref 22–32)
Calcium: 8.2 mg/dL — ABNORMAL LOW (ref 8.9–10.3)
Creatinine, Ser: 1 mg/dL (ref 0.61–1.24)
Glucose, Bld: 114 mg/dL — ABNORMAL HIGH (ref 70–99)
POTASSIUM: 4.7 mmol/L (ref 3.5–5.1)
Sodium: 137 mmol/L (ref 135–145)
Total Bilirubin: 0.5 mg/dL (ref 0.3–1.2)
Total Protein: 6.5 g/dL (ref 6.5–8.1)

## 2018-09-17 LAB — URINALYSIS, COMPLETE (UACMP) WITH MICROSCOPIC
BACTERIA UA: NONE SEEN
BILIRUBIN URINE: NEGATIVE
GLUCOSE, UA: NEGATIVE mg/dL
HGB URINE DIPSTICK: NEGATIVE
Ketones, ur: NEGATIVE mg/dL
LEUKOCYTES UA: NEGATIVE
NITRITE: NEGATIVE
Protein, ur: NEGATIVE mg/dL
SPECIFIC GRAVITY, URINE: 1.02 (ref 1.005–1.030)
pH: 7 (ref 5.0–8.0)

## 2018-09-17 LAB — LIPASE, BLOOD: Lipase: 32 U/L (ref 11–51)

## 2018-09-17 MED ORDER — IOHEXOL 300 MG/ML  SOLN
125.0000 mL | Freq: Once | INTRAMUSCULAR | Status: AC | PRN
  Administered 2018-09-17: 125 mL via INTRAVENOUS

## 2018-09-17 MED ORDER — OXYCODONE HCL 5 MG PO TABS
5.0000 mg | ORAL_TABLET | Freq: Once | ORAL | Status: AC
  Administered 2018-09-17: 5 mg via ORAL
  Filled 2018-09-17: qty 1

## 2018-09-17 MED ORDER — ACETAMINOPHEN 500 MG PO TABS
1000.0000 mg | ORAL_TABLET | Freq: Once | ORAL | Status: AC
  Administered 2018-09-17: 1000 mg via ORAL
  Filled 2018-09-17: qty 2

## 2018-09-17 MED ORDER — ONDANSETRON 4 MG PO TBDP: 4.0000 mg | ORAL_TABLET | Freq: Three times a day (TID) | ORAL | 0 refills | Status: DC | PRN

## 2018-09-17 MED ORDER — TRAMADOL HCL 50 MG PO TABS: 50.0000 mg | ORAL_TABLET | Freq: Four times a day (QID) | ORAL | 0 refills | Status: DC | PRN

## 2018-09-17 MED ORDER — MORPHINE SULFATE (PF) 4 MG/ML IV SOLN
4.0000 mg | Freq: Once | INTRAVENOUS | Status: AC
Start: 2018-09-17 — End: 2018-09-17
  Administered 2018-09-17: 4 mg via INTRAVENOUS
  Filled 2018-09-17: qty 1

## 2018-09-17 MED ORDER — ONDANSETRON HCL 4 MG/2ML IJ SOLN
4.0000 mg | Freq: Once | INTRAMUSCULAR | Status: AC
  Administered 2018-09-17: 4 mg via INTRAVENOUS
  Filled 2018-09-17: qty 2

## 2018-09-17 NOTE — ED Notes (Signed)
ED Provider at bedside. 

## 2018-09-17 NOTE — ED Provider Notes (Signed)
[facility] Emergency Department Provider Note  ____________________________________________  Time seen: Approximately 7:48 AM  I have reviewed the triage vital signs and the nursing notes.   HISTORY  Chief Complaint Abdominal Pain   HPI Calvin Wheeler is a 57 y.o. male with no significant past medical history who presents for evaluation of abdominal pain.  Patient reports sudden onset of severe cramping left flank pain radiating to the left lower quadrant that woke him up from his sleep at 3AM.  He reports that the pain is severe.  Endorses 4 episodes of nonbloody nonbilious emesis associated with it.  Patient reports that he has had several daily episodes of diarrhea over the last 7 days but the vomiting and abdominal pain started today.  No fever but has had chills.  No chest pain, shortness of breath, dysuria, hematuria, URI symptoms.  Patient denies ever having similar abdominal pain.  Patient reports the pain is better when laying still and gets worse when he moves around.  Denies any trauma.  Denies midline back pain or paresthesias of his extremities.   Past Surgical History:  Procedure Laterality Date  . HERNIA REPAIR      Prior to Admission medications   Medication Sig Start Date End Date Taking? Authorizing Provider  amoxicillin (AMOXIL) 500 MG capsule Take 1 capsule (500 mg total) by mouth 3 (three) times daily. 08/22/18   Joni ReiningSmith, Ronald K, PA-C  dicyclomine (BENTYL) 20 MG tablet Take 1 tablet (20 mg total) by mouth 3 (three) times daily as needed for spasms. 03/08/18 03/08/19  Schaevitz, Myra Rudeavid Matthew, MD  docusate sodium (COLACE) 100 MG capsule Take 1 capsule (100 mg total) by mouth 2 (two) times daily. 08/22/18 08/22/19  Joni ReiningSmith, Ronald K, PA-C  fexofenadine-pseudoephedrine (ALLEGRA-D) 60-120 MG 12 hr tablet Take 1 tablet by mouth 2 (two) times daily. 08/22/18   Joni ReiningSmith, Ronald K, PA-C  gentamicin (GARAMYCIN) 0.3 % ophthalmic solution Place 1 drop into  the right eye every 4 (four) hours. 06/16/18   Joni ReiningSmith, Ronald K, PA-C  hydrocortisone (ANUSOL-HC) 2.5 % rectal cream Apply rectally 2 times daily 08/22/18 08/22/19  Joni ReiningSmith, Ronald K, PA-C  metoCLOPramide (REGLAN) 10 MG tablet Take 1 tablet (10 mg total) by mouth every 6 (six) hours as needed for nausea. 03/23/18 03/23/19  Willy Eddyobinson, Patrick, MD  ondansetron (ZOFRAN ODT) 4 MG disintegrating tablet Take 1 tablet (4 mg total) by mouth every 8 (eight) hours as needed. 09/17/18   Nita SickleVeronese, Victor, MD  ondansetron (ZOFRAN) 4 MG tablet Take 1 tablet (4 mg total) by mouth daily as needed. 03/08/18   Myrna BlazerSchaevitz, David Matthew, MD  pantoprazole (PROTONIX) 40 MG tablet Take 1 tablet (40 mg total) by mouth daily. 03/23/18 03/23/19  Willy Eddyobinson, Patrick, MD  traMADol (ULTRAM) 50 MG tablet Take 1 tablet (50 mg total) by mouth every 6 (six) hours as needed. 09/17/18 09/17/19  Nita SickleVeronese, [place], MD    Allergies Patient has no known allergies.  History reviewed. No pertinent family history.  Social History Social History   Tobacco Use  . Smoking status: Current Some Day Smoker    Packs/day: 0.50    Types: Cigarettes  . Smokeless tobacco: Never Used  Substance Use Topics  . Alcohol use: No  . Drug use: Never    Review of Systems  Constitutional: Negative for fever. Eyes: Negative for visual changes. ENT: Negative for sore throat. Neck: No neck pain  Cardiovascular: Negative for chest pain. Respiratory: Negative for shortness of breath. Gastrointestinal: + abdominal  pain, vomiting and diarrhea. Genitourinary: Negative for dysuria. Musculoskeletal: Negative for back pain. Skin: Negative for rash. Neurological: Negative for headaches, weakness or numbness. Psych: No SI or HI  ____________________________________________   PHYSICAL EXAM:  VITAL SIGNS: ED Triage Vitals [09/17/18 0720]  Enc Vitals Group     BP 127/82     Pulse Rate 73     Resp 18     Temp 98.1 F (36.7 C)     Temp Source Oral      SpO2 94 %     Weight 275 lb (124.7 kg)     Height 6\' 2"  (1.88 m)     Head Circumference      Peak Flow      Pain Score 10     Pain Loc      Pain Edu?      Excl. in GC?     Constitutional: Alert and oriented. Well appearing and in no apparent distress. HEENT:      Head: Normocephalic and atraumatic.         Eyes: Conjunctivae are normal. Sclera is non-icteric.       Mouth/Throat: Mucous membranes are moist.       Neck: Supple with no signs of meningismus. Cardiovascular: Regular rate and rhythm. No murmurs, gallops, or rubs. 2+ symmetrical distal pulses are present in all extremities. No JVD. Respiratory: Normal respiratory effort. Lungs are clear to auscultation bilaterally. No wheezes, crackles, or rhonchi.  Gastrointestinal: Soft, tender to palpation over the L quadrants, and non distended with positive bowel sounds. No rebound or guarding. Genitourinary: L CVA tenderness. Bilateral testicles are descended with no tenderness to palpation, bilateral positive cremasteric reflexes are present, no swelling or erythema of the scrotum. No evidence of inguinal hernia. Musculoskeletal: Nontender with normal range of motion in all extremities. No edema, cyanosis, or erythema of extremities. Neurologic: Normal speech and language. Face is symmetric. Moving all extremities. No gross focal neurologic deficits are appreciated. Skin: Skin is warm, dry and intact. No rash noted. Psychiatric: Mood and affect are normal. Speech and behavior are normal.  ____________________________________________   LABS (all labs ordered are listed, but only abnormal results are displayed)  Labs Reviewed  COMPREHENSIVE METABOLIC PANEL - Abnormal; Notable for the following components:      Result Value   Glucose, Bld 114 (*)    Calcium 8.2 (*)    AST 42 (*)    All other components within normal limits  URINALYSIS, COMPLETE (UACMP) WITH MICROSCOPIC - Abnormal; Notable for the following components:   Color,  Urine YELLOW (*)    APPearance CLEAR (*)    All other components within normal limits  LIPASE, BLOOD  CBC   ____________________________________________  EKG  none  ____________________________________________  RADIOLOGY  I have personally reviewed the images performed during this visit and I agree with the Radiologist's read.   Interpretation by Radiologist:  Ct Abdomen Pelvis W Contrast  Result Date: 09/17/2018 CLINICAL DATA:  Acute left-sided abdominal pain. EXAM: CT ABDOMEN AND PELVIS WITH CONTRAST TECHNIQUE: Multidetector CT imaging of the abdomen and pelvis was performed using the standard protocol following bolus administration of intravenous contrast. CONTRAST:  OMNIPAQUE IOHEXOL 300 MG/ML  SOLN COMPARISON:  CT scan of March 29, 2011. FINDINGS: Lower chest: No acute abnormality. Hepatobiliary: No gallstones or biliary dilatation is noted. Stable left hepatic cyst is noted. Pancreas: Unremarkable. No pancreatic ductal dilatation or surrounding inflammatory changes. Spleen: Normal in size without focal abnormality. Adrenals/Urinary Tract: Adrenal glands appear  normal. Stable bilateral renal cysts are noted. No hydronephrosis or renal obstruction is noted. No renal or ureteral calculi are noted. Urinary bladder is unremarkable. Stomach/Bowel: Stomach is within normal limits. Appendix appears normal. No evidence of bowel wall thickening, distention, or inflammatory changes. Vascular/Lymphatic: No significant vascular findings are present. No enlarged abdominal or pelvic lymph nodes. Reproductive: Prostate is unremarkable. Other: Moderate size fat containing left inguinal hernia is noted. No ascites is noted. Musculoskeletal: No acute or significant osseous findings. IMPRESSION: Moderate size fat containing left inguinal hernia. No other significant abnormality seen in the abdomen or pelvis. Electronically Signed   By: Lupita Raider, M.D.   On: 09/17/2018 09:36        ____________________________________________   PROCEDURES  Procedure(s) performed: None Procedures Critical Care performed:  None ____________________________________________   INITIAL IMPRESSION / ASSESSMENT AND PLAN / ED COURSE  57 y.o. male with no significant past medical history who presents for evaluation of L flank, L abdominal pain, vomiting, and diarrhea.  Patient looks uncomfortable, vital signs are within normal limits, has left CVA and left quadrants tenderness on exam.  Differential diagnoses including colitis versus diverticulitis versus SBO versus kidney stone versus pyelonephritis.  Will give IV fluids, Zofran and morphine, will send patient for CT abdomen pelvis, labs are pending.    _________________________ 10:10 AM on 09/17/2018 -----------------------------------------  CT showing a fat-containing left inguinal hernia.  Patient has no tenderness on palpation of his inguinal canal or scrotum, has a normal GU exam.  His pain is mostly flank and left upper abdominal area. His presentation with N/V/D and abdominal pain makes it clinically more likely he has viral gastroenteritis. Will refer to outpatient surgery for evaluation of the hernia. Discussed emergent reevaluation if he noticed bulging or pain in his scrotum as it could be due to a piece of bowel going down the hernia canal.  Patient has no signs of obstruction with no distention and actually several episodes of diarrhea. Discussed with Dr. Maia Plan from surgery who is in agreement with this plan. Patient will be given short course of tramadol, zofran, and recommended close f/u with PCP.   As part of my medical decision making, I reviewed the following data within the electronic MEDICAL RECORD NUMBER Nursing notes reviewed and incorporated, Labs reviewed ,  Old chart reviewed, Radiograph reviewed , A consult was requested and obtained from this/these consultant(s) Surgery, Notes from prior ED visits and [place] Controlled  Substance Database    Pertinent labs & imaging results that were available during my care of the patient were reviewed by me and considered in my medical decision making (see chart for details).    ____________________________________________   FINAL CLINICAL IMPRESSION(S) / ED DIAGNOSES  Final diagnoses:  Nausea vomiting and diarrhea  Viral gastroenteritis  Non-recurrent unilateral inguinal hernia without obstruction or gangrene      NEW MEDICATIONS STARTED DURING THIS VISIT:  ED Discharge Orders         Ordered    traMADol (ULTRAM) 50 MG tablet  Every 6 hours PRN     09/17/18 1019    ondansetron (ZOFRAN ODT) 4 MG disintegrating tablet  Every 8 hours PRN     09/17/18 1019           Note:  This document was prepared using Dragon voice recognition software and may include unintentional dictation errors.    Nita Sickle, MD 09/17/18 1020

## 2018-09-17 NOTE — ED Triage Notes (Addendum)
Pt c/o of L sided pain that began at 3am today. Also c/o N&V&D. In wheelchair. A&O. No distress noted. Denies urinary symptoms.

## 2018-09-22 ENCOUNTER — Emergency Department
Admission: EM | Admit: 2018-09-22 | Discharge: 2018-09-22 | Disposition: A | Discharge status: Home / Self Care | Payer: Commercial Managed Care - HMO | Attending: Emergency Medicine | Admitting: Emergency Medicine

## 2018-09-22 ENCOUNTER — Encounter: Payer: Self-pay | Admitting: Emergency Medicine

## 2018-09-22 ENCOUNTER — Other Ambulatory Visit: Payer: Self-pay

## 2018-09-22 DIAGNOSIS — R1032 Left lower quadrant pain: Secondary | ICD-10-CM

## 2018-09-22 DIAGNOSIS — F17210 Nicotine dependence, cigarettes, uncomplicated: Secondary | ICD-10-CM | POA: Insufficient documentation

## 2018-09-22 DIAGNOSIS — R05 Cough: Secondary | ICD-10-CM | POA: Diagnosis not present

## 2018-09-22 DIAGNOSIS — M7918 Myalgia, other site: Secondary | ICD-10-CM | POA: Diagnosis not present

## 2018-09-22 DIAGNOSIS — Z79899 Other long term (current) drug therapy: Secondary | ICD-10-CM | POA: Insufficient documentation

## 2018-09-22 DIAGNOSIS — R109 Unspecified abdominal pain: Secondary | ICD-10-CM | POA: Diagnosis present

## 2018-09-22 MED ORDER — KETOROLAC TROMETHAMINE 30 MG/ML IJ SOLN
30.0000 mg | Freq: Once | INTRAMUSCULAR | Status: AC
  Administered 2018-09-22: 30 mg via INTRAMUSCULAR
  Filled 2018-09-22: qty 1

## 2018-09-22 MED ORDER — NAPROXEN 500 MG PO TABS: 500.0000 mg | ORAL_TABLET | Freq: Two times a day (BID) | ORAL | 2 refills | Status: DC

## 2018-09-22 NOTE — ED Provider Notes (Signed)
[facility] Emergency Department Provider Note   ____________________________________________    I have reviewed the triage vital signs and the nursing notes.   HISTORY  Chief Complaint Abdominal Pain     HPI Calvin Wheeler is a 57 y.o. male who presents with complaints of left-sided flank pain as well as hemorrhoids.  Patient reports he has had a cough for about a week now and sometimes when he coughs he has pain in his left flank.  He has been wearing a belt which seems to help.  Seen here several days ago had labs, CT abdomen pelvis which was only significant for an inguinal hernia, this is not where he is having pain.  He has taken the tramadol with little improvement.  He does report that his nausea has gotten better.  No fevers or chills.  No dysuria.  No hematuria.  No shortness of breath.   History reviewed. No pertinent past medical history.  There are no active problems to display for this patient.   Past Surgical History:  Procedure Laterality Date  . HERNIA REPAIR      Prior to Admission medications   Medication Sig Start Date End Date Taking? Authorizing Provider  amoxicillin (AMOXIL) 500 MG capsule Take 1 capsule (500 mg total) by mouth 3 (three) times daily. 08/22/18   Joni ReiningSmith, Ronald K, PA-C  dicyclomine (BENTYL) 20 MG tablet Take 1 tablet (20 mg total) by mouth 3 (three) times daily as needed for spasms. 03/08/18 03/08/19  Schaevitz, Myra Rudeavid Matthew, MD  docusate sodium (COLACE) 100 MG capsule Take 1 capsule (100 mg total) by mouth 2 (two) times daily. 08/22/18 08/22/19  Joni ReiningSmith, Ronald K, PA-C  fexofenadine-pseudoephedrine (ALLEGRA-D) 60-120 MG 12 hr tablet Take 1 tablet by mouth 2 (two) times daily. 08/22/18   Joni ReiningSmith, Ronald K, PA-C  gentamicin (GARAMYCIN) 0.3 % ophthalmic solution Place 1 drop into the right eye every 4 (four) hours. 06/16/18   Joni ReiningSmith, Ronald K, PA-C  hydrocortisone (ANUSOL-HC) 2.5 % rectal cream Apply rectally 2 times daily  08/22/18 08/22/19  Joni ReiningSmith, Ronald K, PA-C  metoCLOPramide (REGLAN) 10 MG tablet Take 1 tablet (10 mg total) by mouth every 6 (six) hours as needed for nausea. 03/23/18 03/23/19  Willy Eddyobinson, Patrick, MD  ondansetron (ZOFRAN ODT) 4 MG disintegrating tablet Take 1 tablet (4 mg total) by mouth every 8 (eight) hours as needed. 09/17/18   Nita SickleVeronese, [place], MD  ondansetron (ZOFRAN) 4 MG tablet Take 1 tablet (4 mg total) by mouth daily as needed. 03/08/18   Myrna BlazerSchaevitz, David Matthew, MD  pantoprazole (PROTONIX) 40 MG tablet Take 1 tablet (40 mg total) by mouth daily. 03/23/18 03/23/19  Willy Eddyobinson, Patrick, MD  traMADol (ULTRAM) 50 MG tablet Take 1 tablet (50 mg total) by mouth every 6 (six) hours as needed. 09/17/18 09/17/19  Nita SickleVeronese, [place], MD     Allergies Sulfa antibiotics  History reviewed. No pertinent family history.  Social History Social History   Tobacco Use  . Smoking status: Current Some Day Smoker    Packs/day: 0.50    Types: Cigarettes  . Smokeless tobacco: Never Used  Substance Use Topics  . Alcohol use: No  . Drug use: Never    Review of Systems  Constitutional: No fever/chills Eyes: No visual changes.  ENT: No sore throat. Cardiovascular: Denies chest pain. Respiratory: Denies shortness of breath. Gastrointestinal: Hemorrhoids Genitourinary: Negative for dysuria. Musculoskeletal: Negative for back pain. Skin: Negative for rash. Neurological: Negative for headaches    ____________________________________________  PHYSICAL EXAM:  VITAL SIGNS: ED Triage Vitals [09/22/18 0734]  Enc Vitals Group     BP      Pulse Rate 69     Resp 18     Temp 97.8 F (36.6 C)     Temp Source Oral     SpO2 96 %     Weight 124.7 kg (275 lb)     Height 1.88 m (6\' 2" )     Head Circumference      Peak Flow      Pain Score 10     Pain Loc      Pain Edu?      Excl. in GC?     Constitutional: Alert and oriented.  Eyes: Conjunctivae are normal.   Nose: No  congestion/rhinnorhea. Mouth/Throat: Mucous membranes are moist.    Cardiovascular: Normal rate, regular rhythm. Grossly normal heart sounds.  Good peripheral circulation. Respiratory: Normal respiratory effort.  No retractions. Lungs CTAB. Gastrointestinal: Mild point tenderness left flank overlying oblique muscle, no significant abdominal tenderness to palpation. No distention.  No CVA tenderness.  Musculoskeletal:  Warm and well perfused Neurologic:  Normal speech and language. No gross focal neurologic deficits are appreciated.  Skin:  Skin is warm, dry and intact. No rash noted. Psychiatric: Mood and affect are normal. Speech and behavior are normal.  ____________________________________________   LABS (all labs ordered are listed, but only abnormal results are displayed)  Labs Reviewed - No data to display ____________________________________________  EKG  None ____________________________________________  RADIOLOGY  None ____________________________________________   PROCEDURES  Procedure(s) performed: No  Procedures   Critical Care performed: No ____________________________________________   INITIAL IMPRESSION / ASSESSMENT AND PLAN / ED COURSE  Pertinent labs & imaging results that were available during my care of the patient were reviewed by me and considered in my medical decision making (see chart for details).  Patient well-appearing in no acute distress.  Minimal tenderness which I suspect is muscular given description of worsening with cough and otherwise reassuring abdominal exam.  He also notes hemorrhoids, recommend supportive care for these.  We will give IM Toradol here in the emergency department, treat with anti-inflammatories, outpatient follow-up with PCP.  No indication for additional work-up at this time    ____________________________________________   FINAL CLINICAL IMPRESSION(S) / ED DIAGNOSES  Final diagnoses:  Musculoskeletal pain   Left lower quadrant abdominal pain        Note:  This document was prepared using Dragon voice recognition software and may include unintentional dictation errors.    Jene EveryKinner, Areon Cocuzza, MD 09/22/18 [phone]

## 2018-09-22 NOTE — ED Triage Notes (Signed)
Pt arrived via POV with reports of left sided abd pain, pt seen Saturday for the same states he has been wearing a brace around his stomach to help when coughing.  Pt states he is also having hemorrhoid pain.  Reports some bleeding from hemorrhoids when wiping. Reports seeing spots of blood when wiping.

## 2018-09-22 NOTE — ED Notes (Signed)
Pt alert and oriented X4, active, cooperative, pt in NAD. RR even and unlabored, color WNL.  Discharge and followup instructions reviewed.ambulates safely. Instructed to followup.

## 2018-10-11 ENCOUNTER — Encounter: Payer: Self-pay | Admitting: Emergency Medicine

## 2018-10-11 ENCOUNTER — Emergency Department
Admission: EM | Admit: 2018-10-11 | Discharge: 2018-10-11 | Disposition: A | Discharge status: Home / Self Care | Payer: Commercial Managed Care - HMO | Attending: Emergency Medicine | Admitting: Emergency Medicine

## 2018-10-11 ENCOUNTER — Other Ambulatory Visit: Payer: Self-pay

## 2018-10-11 DIAGNOSIS — Z79899 Other long term (current) drug therapy: Secondary | ICD-10-CM | POA: Insufficient documentation

## 2018-10-11 DIAGNOSIS — K648 Other hemorrhoids: Secondary | ICD-10-CM

## 2018-10-11 DIAGNOSIS — K5900 Constipation, unspecified: Secondary | ICD-10-CM | POA: Diagnosis not present

## 2018-10-11 DIAGNOSIS — F17210 Nicotine dependence, cigarettes, uncomplicated: Secondary | ICD-10-CM | POA: Diagnosis not present

## 2018-10-11 DIAGNOSIS — K625 Hemorrhage of anus and rectum: Secondary | ICD-10-CM | POA: Diagnosis present

## 2018-10-11 LAB — CBC WITH DIFFERENTIAL/PLATELET
Abs Immature Granulocytes: 0.02 10*3/uL (ref 0.00–0.07)
Basophils Absolute: 0 10*3/uL (ref 0.0–0.1)
Basophils Relative: 1 %
Eosinophils Absolute: 0.3 10*3/uL (ref 0.0–0.5)
Eosinophils Relative: 5 %
HCT: 42.5 % (ref 39.0–52.0)
HEMOGLOBIN: 13.5 g/dL (ref 13.0–17.0)
IMMATURE GRANULOCYTES: 0 %
Lymphocytes Relative: 24 %
Lymphs Abs: 1.4 10*3/uL (ref 0.7–4.0)
MCH: 29 pg (ref 26.0–34.0)
MCHC: 31.8 g/dL (ref 30.0–36.0)
MCV: 91.2 fL (ref 80.0–100.0)
Monocytes Absolute: 1.1 10*3/uL — ABNORMAL HIGH (ref 0.1–1.0)
Monocytes Relative: 19 %
NEUTROS ABS: 3 10*3/uL (ref 1.7–7.7)
Neutrophils Relative %: 51 %
Platelets: 243 10*3/uL (ref 150–400)
RBC: 4.66 MIL/uL (ref 4.22–5.81)
RDW: 14.7 % (ref 11.5–15.5)
WBC: 5.9 10*3/uL (ref 4.0–10.5)
nRBC: 0 % (ref 0.0–0.2)

## 2018-10-11 LAB — URINALYSIS, COMPLETE (UACMP) WITH MICROSCOPIC
Bacteria, UA: NONE SEEN
Bilirubin Urine: NEGATIVE
Glucose, UA: NEGATIVE mg/dL
Hgb urine dipstick: NEGATIVE
Ketones, ur: NEGATIVE mg/dL
Leukocytes, UA: NEGATIVE
NITRITE: NEGATIVE
Protein, ur: NEGATIVE mg/dL
Specific Gravity, Urine: 1.019 (ref 1.005–1.030)
pH: 6 (ref 5.0–8.0)

## 2018-10-11 MED ORDER — PRAMOXINE HCL 1 % RE FOAM: 1.0000 "application " | Freq: Three times a day (TID) | RECTAL | 0 refills | Status: DC | PRN

## 2018-10-11 MED ORDER — DOCUSATE SODIUM 100 MG PO CAPS: 100.0000 mg | ORAL_CAPSULE | Freq: Two times a day (BID) | ORAL | 0 refills | Status: DC

## 2018-10-11 NOTE — Discharge Instructions (Addendum)
Call to make an appointment with the gastroenterologist listed on your discharge papers.  Also a prescription for Colace has been sent to your pharmacy to prevent constipation.  Begin using Proctofoam as directed.  When you call to make an appointment with the gastroenterologist let the office know that you were referred by the emergency department.  Also increase fluids especially water, vegetables and fruits.

## 2018-10-11 NOTE — ED Triage Notes (Signed)
Patient complaining of bloody stools this AM, states he noticed bright blood "in the water" and on his stool.  Complaining of rectal pain.  States he has hemorrhoids.  Patient also complaining of burning with urination "for about a month".  "I dont' know what's going on".  Also complaining of cramps in his legs.

## 2018-10-11 NOTE — ED Provider Notes (Signed)
[facility] Emergency Department Provider Note  ____________________________________________   First MD Initiated Contact with Patient 10/11/18 0825     (approximate)  I have reviewed the triage vital signs and the nursing notes.   HISTORY  Chief Complaint Rectal Bleeding; Dysuria; and Leg Pain   HPI Calvin Wheeler is a 58 y.o. male presents to the ED with complaint of "hemorrhoids".  Patient states that he had a bowel movement this morning and noticed bright red blood in the water.  He has been using over-the-counter rectal suppositories for hemorrhoids without any improvement.  Patient states that there is burning and itching in the area.  Patient admits that he has had problems with constipation.  This is the first time he has had this evaluated.  Patient denies any tarry stools.  Patient also complains of burning with urination for approximately 1 month.  He denies any penile discharge.  He rates his pain as a 10/10.   History reviewed. No pertinent past medical history.  There are no active problems to display for this patient.   Past Surgical History:  Procedure Laterality Date  . HERNIA REPAIR      Prior to Admission medications   Medication Sig Start Date End Date Taking? Authorizing Provider  dicyclomine (BENTYL) 20 MG tablet Take 1 tablet (20 mg total) by mouth 3 (three) times daily as needed for spasms. 03/08/18 03/08/19  Schaevitz, Myra Rude, MD  docusate sodium (COLACE) 100 MG capsule Take 1 capsule (100 mg total) by mouth 2 (two) times daily. 10/11/18 10/11/19  Tommi Rumps, PA-C  fexofenadine-pseudoephedrine (ALLEGRA-D) 60-120 MG 12 hr tablet Take 1 tablet by mouth 2 (two) times daily. 08/22/18   Joni Reining, PA-C  hydrocortisone (ANUSOL-HC) 2.5 % rectal cream Apply rectally 2 times daily 08/22/18 08/22/19  Joni Reining, PA-C  pantoprazole (PROTONIX) 40 MG tablet Take 1 tablet (40 mg total) by mouth daily. 03/23/18 03/23/19   Willy Eddy, MD  pramoxine (PROCTOFOAM) 1 % foam Place 1 application rectally 3 (three) times daily as needed for anal itching. 10/11/18   Tommi Rumps, PA-C    Allergies Sulfa antibiotics  No family history on file.  Social History Social History   Tobacco Use  . Smoking status: Current Some Day Smoker    Packs/day: 0.50    Types: Cigarettes  . Smokeless tobacco: Never Used  Substance Use Topics  . Alcohol use: No  . Drug use: Never    Review of Systems Constitutional: No fever/chills Cardiovascular: Denies chest pain. Respiratory: Denies shortness of breath. Gastrointestinal: No abdominal pain.  No nausea, no vomiting.  No diarrhea.  Positive constipation.  Positive for hemorrhoids. Genitourinary: Positive for dysuria. Musculoskeletal: Negative for back pain. Skin: Negative for rash. Neurological: Negative for headaches, focal weakness or numbness. ___________________________________________   PHYSICAL EXAM:  VITAL SIGNS: ED Triage Vitals  Enc Vitals Group     BP 10/11/18 0622 140/86     Pulse Rate 10/11/18 0622 79     Resp 10/11/18 0622 18     Temp 10/11/18 0622 98.6 F (37 C)     Temp Source 10/11/18 0622 Oral     SpO2 10/11/18 0622 97 %     Weight 10/11/18 0623 272 lb (123.4 kg)     Height 10/11/18 0623 6\' 2"  (1.88 m)     Head Circumference --      Peak Flow --      Pain Score 10/11/18 0747 10  Pain Loc --      Pain Edu? --      Excl. in GC? --    Constitutional: Alert and oriented. Well appearing and in no acute distress. Eyes: Conjunctivae are normal.  Head: Atraumatic. Nose: No congestion/rhinnorhea. Neck: No stridor.   Cardiovascular: Normal rate, regular rhythm. Grossly normal heart sounds.  Good peripheral circulation. Respiratory: Normal respiratory effort.  No retractions. Lungs CTAB. Gastrointestinal: Soft and nontender. No distention.  On rectal exam there is no external hemorrhoids present.  No blood is present.  Digital exam  was difficult due to patient's tolerance.  Hemoccult had a faint blue reaction but no frank blood or tarry stool was noted after digital exam. Neurologic:  Normal speech and language. No gross focal neurologic deficits are appreciated. No gait instability. Skin:  Skin is warm, dry and intact. No rash noted. Psychiatric: Mood and affect are normal. Speech and behavior are normal.  ____________________________________________   LABS (all labs ordered are listed, but only abnormal results are displayed)  Labs Reviewed  URINALYSIS, COMPLETE (UACMP) WITH MICROSCOPIC - Abnormal; Notable for the following components:      Result Value   Color, Urine YELLOW (*)    APPearance CLEAR (*)    All other components within normal limits  CBC WITH DIFFERENTIAL/PLATELET - Abnormal; Notable for the following components:   Monocytes Absolute 1.1 (*)    All other components within normal limits    PROCEDURES  Procedure(s) performed: None  Procedures  Critical Care performed: No  ____________________________________________   INITIAL IMPRESSION / ASSESSMENT AND PLAN / ED COURSE  As part of my medical decision making, I reviewed the following data within the electronic MEDICAL RECORD NUMBER Notes from prior ED visits and [place] Controlled Substance Database  Patient presents to the ED with complaint of rectal pain and hemorrhoids.  Patient saw bright red blood this morning after having constipation and straining.  This is the first visit for this complaint.  Digital exam was not tolerated well but no frank blood or external hemorrhoids were seen.  Lab work was reassuring that patient and patient has baseline labs now.  I explained to him that he would need to see a gastroenterologist to have an exam and further evaluation.  Patient denies any symptoms other than itching at this time.  He was given a prescription for Colace to take twice daily to prevent further constipation and also a prescription for  Proctofoam.  He is to return to the emergency department if any severe worsening of his symptoms.  ____________________________________________   FINAL CLINICAL IMPRESSION(S) / ED DIAGNOSES  Final diagnoses:  Hemorrhoids, internal  Constipation, unspecified constipation type     ED Discharge Orders         Ordered    docusate sodium (COLACE) 100 MG capsule  2 times daily     10/11/18 1135    pramoxine (PROCTOFOAM) 1 % foam  3 times daily PRN     10/11/18 1135           Note:  This document was prepared using Dragon voice recognition software and may include unintentional dictation errors.    Tommi Rumps, PA-C 10/11/18 1435    Rockne Menghini, MD 10/12/18 1531

## 2018-10-11 NOTE — ED Notes (Signed)
See triage note  States he has noticed some blood on paper and in stools   Unsure if he has hemorrhoids  States blood is bright   Has been seen for same in past but unable to f/u   Also states he is having some burning with urination  States he did have some blisters on penis

## 2018-10-13 ENCOUNTER — Ambulatory Visit (INDEPENDENT_AMBULATORY_CARE_PROVIDER_SITE_OTHER): Payer: 59 | Admitting: Gastroenterology

## 2018-10-13 ENCOUNTER — Other Ambulatory Visit: Payer: Self-pay

## 2018-10-13 ENCOUNTER — Encounter: Payer: Self-pay | Admitting: Gastroenterology

## 2018-10-13 VITALS — BP 136/89 | HR 72 | Resp 17 | Wt 272.8 lb

## 2018-10-13 DIAGNOSIS — K625 Hemorrhage of anus and rectum: Secondary | ICD-10-CM

## 2018-10-13 DIAGNOSIS — K602 Anal fissure, unspecified: Secondary | ICD-10-CM | POA: Diagnosis not present

## 2018-10-13 DIAGNOSIS — K641 Second degree hemorrhoids: Secondary | ICD-10-CM | POA: Diagnosis not present

## 2018-10-13 NOTE — Progress Notes (Signed)
Arlyss Repress, MD [address]  [address]  [place], [place] [address]  Main: [phone]  Fax: [phone]    Gastroenterology Consultation  Referring Provider:     No ref. provider found Primary Care Physician:  Patient, No Pcp Per Primary Gastroenterologist:  Dr. Arlyss Repress Reason for Consultation:     Rectal bleeding        HPI:   Calvin Wheeler is a 58 y.o. African-American male referred from emergency room for consultation and management of rectal bleeding associated with severe rectal discomfort, itching, prolapse, irritation and chronic constipation.  Patient reports that he has been experiencing the symptoms for last 3 months.  He went to ER on 10/11/2018 secondary to episode of rectal bleeding which was bright red blood in the toilet bowl along with a bowel movement.  Prior to that, in 09/2018 he went to ER due to left sided abdominal pain, underwent CT A/P which revealed left-sided inguinal hernia containing fat.  His hemoglobin was normal.  Patient had rectal discomfort on rectal exam in the ER by the nurse practitioner.  He was discharged on Proctofoam, Colace for constipation.  He reports having constipation associated with hard stools and straining for which he has been taking laxative.  He tried stool softener which did not help.  Today, patient presents to my office under the impression that he will undergo some sort of exam/procedure today as he was told in the ER.  He was upset when I told him that he will not undergo banding or colonoscopy today, rather came as a new patient visit for further evaluation.  He was angry and upset and he wanted to leave as he did not want to pay for this visit.  When I tried to convince him that I am here to help him and address his issues, he calmed down and apologized to me, also became tearful about his behavior.  Patient otherwise denies abdominal pain, nausea or vomiting.  He does now drink alcohol, smokes tobacco  NSAIDs:  None  Antiplts/Anticoagulants/Anti thrombotics: None  GI Procedures: None He denies family history of GI malignancy He denies any abdominal surgeries  No past medical history on file.  Past Surgical History:  Procedure Laterality Date  . HERNIA REPAIR      Current Outpatient Medications:  .  pramoxine (PROCTOFOAM) 1 % foam, Place 1 application rectally 3 (three) times daily as needed for anal itching., Disp: 15 g, Rfl: 0 .  dicyclomine (BENTYL) 20 MG tablet, Take 1 tablet (20 mg total) by mouth 3 (three) times daily as needed for spasms. (Patient not taking: Reported on 10/13/2018), Disp: 30 tablet, Rfl: 0 .  docusate sodium (COLACE) 100 MG capsule, Take 1 capsule (100 mg total) by mouth 2 (two) times daily. (Patient not taking: Reported on 10/13/2018), Disp: 60 capsule, Rfl: 0 .  fexofenadine-pseudoephedrine (ALLEGRA-D) 60-120 MG 12 hr tablet, Take 1 tablet by mouth 2 (two) times daily. (Patient not taking: Reported on 10/13/2018), Disp: 20 tablet, Rfl: 0 .  guaifenesin (ROBITUSSIN) 100 MG/5ML syrup, Take by mouth., Disp: , Rfl:  .  HYDROcodone-ibuprofen (VICOPROFEN) 7.5-200 MG tablet, Take by mouth., Disp: , Rfl:  .  hydrocortisone (ANUSOL-HC) 2.5 % rectal cream, Apply rectally 2 times daily (Patient not taking: Reported on 10/13/2018), Disp: 30 g, Rfl: 1 .  ibuprofen (ADVIL,MOTRIN) 200 MG tablet, Take by mouth., Disp: , Rfl:  .  pantoprazole (PROTONIX) 40 MG tablet, Take 1 tablet (40 mg total) by mouth  daily. (Patient not taking: Reported on 10/13/2018), Disp: 30 tablet, Rfl: 1  No family history on file.   Social History   Tobacco Use  . Smoking status: Current Some Day Smoker    Packs/day: 0.50    Types: Cigarettes  . Smokeless tobacco: Never Used  Substance Use Topics  . Alcohol use: No  . Drug use: Never    Allergies as of 10/13/2018 - Review Complete 10/13/2018  Allergen Reaction Noted  . Sulfa antibiotics Other (See Comments) 10/01/2014    Review of Systems:    All  systems reviewed and negative except where noted in HPI.   Physical Exam:  BP 136/89 (BP Location: Left Arm, Patient Position: Sitting, Cuff Size: Large)   Pulse 72   Resp 17   Wt 272 lb 12.8 oz (123.7 kg)   BMI 35.03 kg/m  No LMP for male patient.  General:   Alert,  Well-developed, well-nourished, pleasant and cooperative in NAD Head:  Normocephalic and atraumatic. Eyes:  Sclera clear, no icterus.   Conjunctiva pink. Ears:  Normal auditory acuity. Nose:  No deformity, discharge, or lesions. Mouth:  No deformity or lesions,oropharynx pink & moist. Neck:  Supple; no masses or thyromegaly. Lungs:  Respirations even and unlabored.  Clear throughout to auscultation.   No wheezes, crackles, or rhonchi. No acute distress. Heart:  Regular rate and rhythm; no murmurs, clicks, rubs, or gallops. Abdomen:  Normal bowel sounds. Soft, non-tender and non-distended without masses, hepatosplenomegaly or hernias noted.  No guarding or rebound tenderness.   Rectal: Severe tenderness in the posterior anal canal consistent with anal fissure, enlarged hemorrhoids, normal perianal exam Msk:  Symmetrical without gross deformities. Good, equal movement & strength bilaterally. Pulses:  Normal pulses noted. Extremities:  No clubbing or edema.  No cyanosis. Neurologic:  Alert and oriented x3;  grossly normal neurologically. Skin:  Intact without significant lesions or rashes. No jaundice. Psych:  Alert and cooperative. Normal mood and affect.  Imaging Studies: CT A/P 09/17/2018 IMPRESSION: Moderate size fat containing left inguinal hernia. No other significant abnormality seen in the abdomen or pelvis.  Assessment and Plan:   Jearld AdjutantRichard J Smithers is a 58 y.o. African-American male with 5637-month history of rectal bleeding and symptomatic hemorrhoids, exam today consistent with anal fissure.  He denied having a colonoscopy in the past  Anal fissure Recommend topical 0.125% nitro with lidocaine, instructions  provided Avoid constipation with stool softener and high-fiber diet, fiber supplements.  Handouts provided  Grade 2 symptomatic hemorrhoids Discussed with him about hemorrhoid ligation after the colonoscopy He will continue Anusol cream twice daily  Rectal bleeding Recommend colonoscopy first prior to undergoing hemorrhoid ligation   Follow up in 4 weeks   Arlyss Repressohini R Vanga, MD

## 2018-10-13 NOTE — Patient Instructions (Signed)

## 2018-10-15 ENCOUNTER — Emergency Department: Payer: 59

## 2018-10-15 ENCOUNTER — Emergency Department
Admission: EM | Admit: 2018-10-15 | Discharge: 2018-10-15 | Disposition: A | Discharge status: Home / Self Care | Payer: 59 | Attending: Emergency Medicine | Admitting: Emergency Medicine

## 2018-10-15 DIAGNOSIS — F17210 Nicotine dependence, cigarettes, uncomplicated: Secondary | ICD-10-CM | POA: Insufficient documentation

## 2018-10-15 DIAGNOSIS — B9789 Other viral agents as the cause of diseases classified elsewhere: Secondary | ICD-10-CM | POA: Insufficient documentation

## 2018-10-15 DIAGNOSIS — J069 Acute upper respiratory infection, unspecified: Secondary | ICD-10-CM

## 2018-10-15 DIAGNOSIS — Z79899 Other long term (current) drug therapy: Secondary | ICD-10-CM | POA: Insufficient documentation

## 2018-10-15 LAB — INFLUENZA PANEL BY PCR (TYPE A & B)
Influenza A By PCR: NEGATIVE
Influenza B By PCR: NEGATIVE

## 2018-10-15 MED ORDER — ALBUTEROL SULFATE HFA 108 (90 BASE) MCG/ACT IN AERS: 2.0000 | INHALATION_SPRAY | Freq: Four times a day (QID) | RESPIRATORY_TRACT | 2 refills | Status: DC | PRN

## 2018-10-15 MED ORDER — PSEUDOEPH-BROMPHEN-DM 30-2-10 MG/5ML PO SYRP: 5.0000 mL | ORAL_SOLUTION | Freq: Four times a day (QID) | ORAL | 0 refills | Status: DC | PRN

## 2018-10-15 MED ORDER — IPRATROPIUM-ALBUTEROL 0.5-2.5 (3) MG/3ML IN SOLN
3.0000 mL | Freq: Once | RESPIRATORY_TRACT | Status: AC
  Administered 2018-10-15: 3 mL via RESPIRATORY_TRACT
  Filled 2018-10-15: qty 3

## 2018-10-15 NOTE — ED Triage Notes (Signed)
Patient c/o productive cough, chills, body aches for several weeks.

## 2018-10-15 NOTE — Discharge Instructions (Signed)
Follow-up with [facility] if any continued problems or not improving.  Also establish a primary care provider with 1 the clinics listed on your discharge papers.  You may also go to any clinic that is taking new patients.  Began taking the Bromfed DM as needed for cough and congestion.  Discontinue taking the over-the-counter medication that you have at home as it is uncertain what medication this is.  Also a albuterol inhaler was prescribed 2 puffs every 6 hours as needed for wheezing. Increase fluids.  Discontinue smoking during the time that you are sick and consider discontinue smoking completely.

## 2018-10-15 NOTE — ED Provider Notes (Signed)
[facility] Emergency Department Provider Note   ____________________________________________   First MD Initiated Contact with Patient 10/15/18 [phone]     (approximate)  I have reviewed the triage vital signs and the nursing notes.   HISTORY  Chief Complaint Cough    HPI Calvin Wheeler is a 58 y.o. male presents to the ED with complaint of productive cough, chills, body aches for several weeks.  Patient has been using over-the-counter medication without any relief.  He states that this morning he heard himself wheeze.  He currently is smoking.  He denies any previous asthma.  He is unaware of any actual fever but has felt warm.  He is not aware of any exposure to flu.  Patient was seen in the ED this week for hemorrhoids by this provider and did not complain of any the symptoms that is listed above that he claims he is suffered for the last several weeks.   History reviewed. No pertinent past medical history.  There are no active problems to display for this patient.   Past Surgical History:  Procedure Laterality Date  . HERNIA REPAIR      Prior to Admission medications   Medication Sig Start Date End Date Taking? Authorizing Provider  albuterol (PROVENTIL HFA;VENTOLIN HFA) 108 (90 Base) MCG/ACT inhaler Inhale 2 puffs into the lungs every 6 (six) hours as needed for wheezing or shortness of breath. 10/15/18   Tommi Rumps, PA-C  brompheniramine-pseudoephedrine-DM 30-2-10 MG/5ML syrup Take 5 mLs by mouth 4 (four) times daily as needed. 10/15/18   Tommi Rumps, PA-C  HYDROcodone-ibuprofen (VICOPROFEN) 7.5-200 MG tablet Take by mouth. 04/26/14   [provider]  ibuprofen (ADVIL,MOTRIN) 200 MG tablet Take by mouth.    [provider]  pramoxine (PROCTOFOAM) 1 % foam Place 1 application rectally 3 (three) times daily as needed for anal itching. 10/11/18   Tommi Rumps, PA-C    Allergies Sulfa antibiotics  No family history  on file.  Social History Social History   Tobacco Use  . Smoking status: Current Some Day Smoker    Packs/day: 0.50    Types: Cigarettes  . Smokeless tobacco: Never Used  Substance Use Topics  . Alcohol use: No  . Drug use: Never    Review of Systems Constitutional: Subjective fever/chills Eyes: No visual changes. ENT: No sore throat. Cardiovascular: Denies chest pain. Respiratory: Denies shortness of breath.  Positive productive cough. Gastrointestinal: No abdominal pain.  No nausea, no vomiting.  No diarrhea.   Musculoskeletal: Positive for body aches. Skin: Negative for rash. Neurological: Negative for headaches, focal weakness or numbness. ___________________________________________   PHYSICAL EXAM:  VITAL SIGNS: ED Triage Vitals  Enc Vitals Group     BP 10/15/18 0641 (!) 125/106     Pulse Rate 10/15/18 0641 85     Resp 10/15/18 0641 17     Temp 10/15/18 0641 98.4 F (36.9 C)     Temp src --      SpO2 10/15/18 0641 97 %     Weight 10/15/18 0638 273 lb 5.9 oz (124 kg)     Height 10/15/18 0638 6\' 2"  (1.88 m)     Head Circumference --      Peak Flow --      Pain Score 10/15/18 0638 10     Pain Loc --      Pain Edu? --      Excl. in GC? --    Constitutional: Alert and oriented.  Well appearing and in no acute distress. Eyes: Conjunctivae are normal.  Head: Atraumatic. Nose: Mild congestion/rhinnorhea.  TMs are dull bilaterally. Mouth/Throat: Mucous membranes are moist.  Oropharynx non-erythematous. Neck: No stridor.   Hematological/Lymphatic/Immunilogical: No cervical lymphadenopathy. Cardiovascular: Normal rate, regular rhythm. Grossly normal heart sounds.  Good peripheral circulation. Respiratory: Normal respiratory effort.  No retractions. Lungs CTAB. Gastrointestinal: Soft and nontender. No distention. No abdominal bruits. No CVA tenderness. Musculoskeletal: Moves upper and lower extremities any difficulty normal gait was noted. Neurologic:  Normal  speech and language. No gross focal neurologic deficits are appreciated. No gait instability. Skin:  Skin is warm, dry and intact. No rash noted. Psychiatric: Mood and affect are normal. Speech and behavior are normal.  ____________________________________________   LABS (all labs ordered are listed, but only abnormal results are displayed)  Labs Reviewed  INFLUENZA PANEL BY PCR (TYPE A & B)    RADIOLOGY  ED MD interpretation:  Chest x-ray without infiltrate.  Official radiology report(s): Dg Chest 2 View  Result Date: 10/15/2018 CLINICAL DATA:  Shortness of breath, productive cough. EXAM: CHEST - 2 VIEW COMPARISON:  Radiographs of November 30, 2017. FINDINGS: The heart size and mediastinal contours are within normal limits. Both lungs are clear. No pneumothorax pleural effusion is noted. The visualized skeletal structures are unremarkable. IMPRESSION: No active cardiopulmonary disease. Electronically Signed   By: Lupita RaiderJames  Green Jr, M.D.   On: 10/15/2018 07:39    ____________________________________________   PROCEDURES  Procedure(s) performed: None  Procedures  Critical Care performed: No  ____________________________________________   INITIAL IMPRESSION / ASSESSMENT AND PLAN / ED COURSE  As part of my medical decision making, I reviewed the following data within the electronic MEDICAL RECORD NUMBER Notes from prior ED visits and [place] Controlled Substance Database  Patient presents to the ED with complaint of reported productive cough, chills and body ache for several weeks.  As noted above patient was seen in the ED this week and did not complain of any of the above.  He is currently a smoker.  Patient was given a DuoNeb treatment while in the ED which helped with his cough.  Influenza test was negative and chest x-ray was reassuring that he did not have pneumonia.  He is to discontinue taking the over-the-counter medication as he is unsure what he is taking.  He was given a  prescription for Bromfed-DM as needed for cough and congestion.  He was also given an inhaler to use as needed for cough and wheezing.  He is to follow-up with his PCP or 1 of the clinics listed on his discharge papers.  He was encouraged to obtain a PCP.  Patient was in stable condition at the time of his discharge.  ____________________________________________   FINAL CLINICAL IMPRESSION(S) / ED DIAGNOSES  Final diagnoses:  Viral URI with cough     ED Discharge Orders         Ordered    albuterol (PROVENTIL HFA;VENTOLIN HFA) 108 (90 Base) MCG/ACT inhaler  Every 6 hours PRN     10/15/18 0824    brompheniramine-pseudoephedrine-DM 30-2-10 MG/5ML syrup  4 times daily PRN     10/15/18 0824           Note:  This document was prepared using Dragon voice recognition software and may include unintentional dictation errors.    Tommi RumpsSummers, Rhonda L, PA-C 10/15/18 1540    Jeanmarie PlantMcShane, James A, MD 10/16/18 1435

## 2018-10-15 NOTE — ED Notes (Signed)
Pt to ed with c/o cough, congestion and body aches x 2 weeks.  Pt denies fever. Reports mild sore throat.

## 2018-11-03 ENCOUNTER — Encounter
Admission: RE | Disposition: A | Discharge status: Home / Self Care | Payer: Self-pay | Source: Home / Self Care | Attending: Gastroenterology

## 2018-11-03 ENCOUNTER — Ambulatory Visit
Admission: RE | Admit: 2018-11-03 | Discharge: 2018-11-03 | Disposition: A | Discharge status: Home / Self Care | Payer: 59 | Attending: Gastroenterology | Admitting: Gastroenterology

## 2018-11-03 ENCOUNTER — Ambulatory Visit: Payer: 59 | Admitting: Certified Registered"

## 2018-11-03 DIAGNOSIS — Z5309 Procedure and treatment not carried out because of other contraindication: Secondary | ICD-10-CM | POA: Insufficient documentation

## 2018-11-03 DIAGNOSIS — K625 Hemorrhage of anus and rectum: Secondary | ICD-10-CM | POA: Diagnosis not present

## 2018-11-03 DIAGNOSIS — K648 Other hemorrhoids: Secondary | ICD-10-CM | POA: Insufficient documentation

## 2018-11-03 DIAGNOSIS — F17210 Nicotine dependence, cigarettes, uncomplicated: Secondary | ICD-10-CM | POA: Insufficient documentation

## 2018-11-03 DIAGNOSIS — Z7951 Long term (current) use of inhaled steroids: Secondary | ICD-10-CM | POA: Insufficient documentation

## 2018-11-03 DIAGNOSIS — K219 Gastro-esophageal reflux disease without esophagitis: Secondary | ICD-10-CM | POA: Insufficient documentation

## 2018-11-03 DIAGNOSIS — K644 Residual hemorrhoidal skin tags: Secondary | ICD-10-CM | POA: Insufficient documentation

## 2018-11-03 DIAGNOSIS — Z882 Allergy status to sulfonamides status: Secondary | ICD-10-CM | POA: Insufficient documentation

## 2018-11-03 DIAGNOSIS — Z791 Long term (current) use of non-steroidal anti-inflammatories (NSAID): Secondary | ICD-10-CM | POA: Insufficient documentation

## 2018-11-03 DIAGNOSIS — Z79899 Other long term (current) drug therapy: Secondary | ICD-10-CM | POA: Insufficient documentation

## 2018-11-03 HISTORY — PX: COLONOSCOPY WITH PROPOFOL: SHX5780

## 2018-11-03 SURGERY — COLONOSCOPY WITH PROPOFOL
Anesthesia: General

## 2018-11-03 MED ORDER — PROPOFOL 10 MG/ML IV BOLUS
INTRAVENOUS | Status: DC | PRN
  Administered 2018-11-03 (×3): 50 mg via INTRAVENOUS

## 2018-11-03 MED ORDER — SODIUM CHLORIDE 0.9 % IV SOLN
INTRAVENOUS | Status: DC
  Administered 2018-11-03 (×2): via INTRAVENOUS

## 2018-11-03 NOTE — Anesthesia Post-op Follow-up Note (Signed)
Anesthesia QCDR form completed.        

## 2018-11-03 NOTE — Anesthesia Postprocedure Evaluation (Signed)
Anesthesia Post Note  Patient: Calvin Wheeler  Procedure(s) Performed: COLONOSCOPY WITH PROPOFOL (N/A )  Patient location during evaluation: Endoscopy Anesthesia Type: General Level of consciousness: awake and alert Pain management: pain level controlled Vital Signs Assessment: post-procedure vital signs reviewed and stable Respiratory status: spontaneous breathing, nonlabored ventilation, respiratory function stable and patient connected to nasal cannula oxygen Cardiovascular status: blood pressure returned to baseline and stable Postop Assessment: no apparent nausea or vomiting Anesthetic complications: no     Last Vitals:  Vitals:   11/03/18 1011 11/03/18 1012  BP:    Pulse: (!) 48 (!) 48  Resp: 14 14  Temp:    SpO2: 100% 100%    Last Pain:  Vitals:   11/03/18 1012  TempSrc:   PainSc: 0-No pain                 Lenard Simmer

## 2018-11-03 NOTE — Transfer of Care (Signed)
Immediate Anesthesia Transfer of Care Note  Patient: Calvin Wheeler  Procedure(s) Performed: COLONOSCOPY WITH PROPOFOL (N/A )  Patient Location: PACU and Endoscopy Unit  Anesthesia Type:General  Level of Consciousness: awake, alert , oriented and patient cooperative  Airway & Oxygen Therapy: Patient Spontanous Breathing  Post-op Assessment: Report given to RN and Post -op Vital signs reviewed and stable  Post vital signs: Reviewed and stable  Last Vitals:  Vitals Value Taken Time  BP 103/70 11/03/2018  9:42 AM  Temp 36.4 C 11/03/2018  9:41 AM  Pulse 69 11/03/2018  9:43 AM  Resp 27 11/03/2018  9:43 AM  SpO2 98 % 11/03/2018  9:43 AM  Vitals shown include unvalidated device data.  Last Pain:  Vitals:   11/03/18 0941  TempSrc:   PainSc: 0-No pain         Complications: No apparent anesthesia complications

## 2018-11-03 NOTE — Op Note (Signed)
[facility] Patient Name: Calvin Wheeler Procedure Date: 11/03/2018 9:17 AM MRN: [id number] Account #: [id number] Date of Birth: 04-Apr-1961 Admit Type: Outpatient Age: 58 Room: [facility] ENDO ROOM 2 Gender: Male Note Status: Finalized Procedure:            Colonoscopy Indications:          Rectal bleeding Providers:            Lin Landsman MD, MD Medicines:            Monitored Anesthesia Care Complications:        No immediate complications. Estimated blood loss: None. Procedure:            Pre-Anesthesia Assessment:                       - Prior to the procedure, a History and Physical was                        performed, and patient medications and allergies were                        reviewed. The patient is competent. The risks and                        benefits of the procedure and the sedation options and                        risks were discussed with the patient. All questions                        were answered and informed consent was obtained.                        Patient identification and proposed procedure were                        verified by the physician, the nurse, the                        anesthesiologist, the anesthetist and the technician in                        the pre-procedure area in the procedure room in the                        endoscopy suite. Mental Status Examination: alert and                        oriented. Airway Examination: normal oropharyngeal                        airway and neck mobility. Respiratory Examination:                        clear to auscultation. CV Examination: normal.                        Prophylactic Antibiotics: The patient does not require  prophylactic antibiotics. Prior Anticoagulants: The                        patient has taken no previous anticoagulant or                        antiplatelet agents. ASA Grade Assessment: II - A     patient with mild systemic disease. After reviewing the                        risks and benefits, the patient was deemed in                        satisfactory condition to undergo the procedure. The                        anesthesia plan was to use monitored anesthesia care                        (MAC). Immediately prior to administration of                        medications, the patient was re-assessed for adequacy                        to receive sedatives. The heart rate, respiratory rate,                        oxygen saturations, blood pressure, adequacy of                        pulmonary ventilation, and response to care were                        monitored throughout the procedure. The physical status                        of the patient was re-assessed after the procedure.                       After obtaining informed consent, the colonoscope was                        passed under direct vision. Throughout the procedure,                        the patient's blood pressure, pulse, and oxygen                        saturations were monitored continuously. The                        Colonoscope was introduced through the anus and                        advanced to the the ascending colon. The colonoscopy                        was unusually difficult due to poor bowel prep with  stool present. The patient tolerated the procedure                        well. The quality of the bowel preparation was                        evaluated using the BBPS [facility] Bowel Preparation                        Scale) with scores of: Right Colon = 0 (unprepared,                        mucosa not seen due to solid stool that cannot be                        cleared or unseen proximal colon segment in a                        colonoscopy aborted due to inadequate bowel prep),                        Transverse Colon = 2 (minor amount of residual                         staining, small fragments of stool and/or opaque                        liquid, but mucosa seen well) and Left Colon = 1                        (portion of mucosa seen, but other areas not well seen                        due to staining, residual stool and/or opaque liquid).                        The total BBPS score equals 3. The quality of the bowel                        preparation was inadequate. Findings:      Hemorrhoids were found on perianal exam.      Extensive amounts of semi-solid stool was found in the entire colon,       precluding visualization. Procedure aborted and scope withdrawn      No large lesions/polyps identified      Non-bleeding external and internal hemorrhoids were found during       retroflexion and during perianal exam. The hemorrhoids were large. Impression:           - Preparation of the colon was inadequate.                       - Hemorrhoids found on perianal exam.                       - Stool in the entire examined colon.                       - Non-bleeding external and internal hemorrhoids.                       -  No specimens collected. Recommendation:       - Discharge patient to home (with spouse).                       - Resume previous diet today.                       - Continue present medications.                       - Repeat colonoscopy tomorrow because the bowel                        preparation was poor if patient agreeable.                       - Return to my office in 2-3 weeks for hemorrhoid                        ligation. Procedure Code(s):    --- Professional ---                       [id number], 30, Colonoscopy, flexible; diagnostic, including                        collection of specimen(s) by brushing or washing, when                        performed (separate procedure) Diagnosis Code(s):    --- Professional ---                       K64.8, Other hemorrhoids                       K62.5, Hemorrhage of anus and rectum CPT  copyright 2018 American Medical Association. All rights reserved. The codes documented in this report are preliminary and upon coder review may  be revised to meet current compliance requirements. Dr. Ulyess Mort Lin Landsman MD, MD 11/03/2018 9:41:14 AM This report has been signed electronically. Number of Addenda: 0 Note Initiated On: 11/03/2018 9:17 AM Total Procedure Duration: 0 hours 7 minutes 22 seconds       [facility]

## 2018-11-03 NOTE — H&P (Signed)
Arlyss Repress, MD [address]  [address]  [place], [place] [address]  Main: [phone]  Fax: [phone] Pager: [phone]  Primary Care Physician:  Patient, No Pcp Per Primary Gastroenterologist:  Dr. Arlyss Repress  Pre-Procedure History & Physical: HPI:  Calvin Wheeler is a 58 y.o. male is here for an colonoscopy.   History reviewed. No pertinent past medical history.  Past Surgical History:  Procedure Laterality Date  . HERNIA REPAIR      Prior to Admission medications   Medication Sig Start Date End Date Taking? Authorizing Provider  albuterol (PROVENTIL HFA;VENTOLIN HFA) 108 (90 Base) MCG/ACT inhaler Inhale 2 puffs into the lungs every 6 (six) hours as needed for wheezing or shortness of breath. 10/15/18  Yes Bridget Hartshorn L, PA-C  ibuprofen (ADVIL,MOTRIN) 200 MG tablet Take by mouth.   Yes [provider]  pramoxine (PROCTOFOAM) 1 % foam Place 1 application rectally 3 (three) times daily as needed for anal itching. 10/11/18  Yes Bridget Hartshorn L, PA-C  brompheniramine-pseudoephedrine-DM 30-2-10 MG/5ML syrup Take 5 mLs by mouth 4 (four) times daily as needed. 10/15/18   Tommi Rumps, PA-C  HYDROcodone-ibuprofen (VICOPROFEN) 7.5-200 MG tablet Take by mouth. 04/26/14   [provider]    Allergies as of 10/14/2018 - Review Complete 10/13/2018  Allergen Reaction Noted  . Sulfa antibiotics Other (See Comments) 10/01/2014    History reviewed. No pertinent family history.  Social History   Socioeconomic History  . Marital status: Single    Spouse name: Not on file  . Number of children: Not on file  . Years of education: Not on file  . Highest education level: Not on file  Occupational History  . Not on file  Social Needs  . Financial resource strain: Not on file  . Food insecurity:    Worry: Not on file    Inability: Not on file  . Transportation needs:    Medical: Not on file    Non-medical: Not on file  Tobacco Use  .  Smoking status: Current Some Day Smoker    Packs/day: 0.50    Types: Cigarettes  . Smokeless tobacco: Never Used  Substance and Sexual Activity  . Alcohol use: No  . Drug use: Never  . Sexual activity: Not on file  Lifestyle  . Physical activity:    Days per week: Not on file    Minutes per session: Not on file  . Stress: Not on file  Relationships  . Social connections:    Talks on phone: Not on file    Gets together: Not on file    Attends religious service: Not on file    Active member of club or organization: Not on file    Attends meetings of clubs or organizations: Not on file    Relationship status: Not on file  . Intimate partner violence:    Fear of current or ex partner: Not on file    Emotionally abused: Not on file    Physically abused: Not on file    Forced sexual activity: Not on file  Other Topics Concern  . Not on file  Social History Narrative  . Not on file    Review of Systems: See HPI, otherwise negative ROS  Physical Exam: BP (!) 138/93   Pulse 72   Temp (!) 96.5 F (35.8 C) (Tympanic)   Resp 18   Ht 6\' 2"  (1.88 m)   Wt 117.9 kg   BMI 33.38 kg/m  General:   Alert,  pleasant and cooperative in NAD Head:  Normocephalic and atraumatic. Neck:  Supple; no masses or thyromegaly. Lungs:  Clear throughout to auscultation.    Heart:  Regular rate and rhythm. Abdomen:  Soft, nontender and nondistended. Normal bowel sounds, without guarding, and without rebound.   Neurologic:  Alert and  oriented x4;  grossly normal neurologically.  Impression/Plan: Calvin Wheeler is here for an colonoscopy to be performed for rectal bleeding  Risks, benefits, limitations, and alternatives regarding  colonoscopy have been reviewed with the patient.  Questions have been answered.  All parties agreeable.   Lannette Donath, MD  11/03/2018, 9:17 AM

## 2018-11-03 NOTE — Anesthesia Preprocedure Evaluation (Signed)
Anesthesia Evaluation  Patient identified by MRN, date of birth, ID band Patient awake    Reviewed: Allergy & Precautions, H&P , NPO status , Patient's Chart, lab work & pertinent test results, reviewed documented beta blocker date and time   History of Anesthesia Complications Negative for: history of anesthetic complications  Airway Mallampati: I  TM Distance: >3 FB Neck ROM: full    Dental no notable dental hx. (+) Dental Advidsory Given, Missing, Teeth Intact   Pulmonary neg shortness of breath, asthma , neg sleep apnea, neg COPD, neg recent URI, Current Smoker,           Cardiovascular Exercise Tolerance: Good negative cardio ROS       Neuro/Psych negative neurological ROS  negative psych ROS   GI/Hepatic Neg liver ROS, GERD  ,  Endo/Other  negative endocrine ROS  Renal/GU negative Renal ROS  negative genitourinary   Musculoskeletal   Abdominal   Peds  Hematology negative hematology ROS (+)   Anesthesia Other Findings History reviewed. No pertinent past medical history.   Reproductive/Obstetrics negative OB ROS                             Anesthesia Physical Anesthesia Plan  ASA: II  Anesthesia Plan: General   Post-op Pain Management:    Induction: Intravenous  PONV Risk Score and Plan: 1 and Propofol infusion and TIVA  Airway Management Planned: Nasal Cannula and Natural Airway  Additional Equipment:   Intra-op Plan:   Post-operative Plan:   Informed Consent: I have reviewed the patients History and Physical, chart, labs and discussed the procedure including the risks, benefits and alternatives for the proposed anesthesia with the patient or authorized representative who has indicated his/her understanding and acceptance.     Dental Advisory Given  Plan Discussed with: Anesthesiologist, CRNA and Surgeon  Anesthesia Plan Comments:         Anesthesia Quick  Evaluation

## 2018-11-04 ENCOUNTER — Ambulatory Visit
Admission: RE | Admit: 2018-11-04 | Discharge: 2018-11-04 | Disposition: A | Discharge status: Home / Self Care | Payer: 59 | Attending: Gastroenterology | Admitting: Gastroenterology

## 2018-11-04 ENCOUNTER — Encounter: Payer: Self-pay | Admitting: Anesthesiology

## 2018-11-04 ENCOUNTER — Encounter
Admission: RE | Disposition: A | Discharge status: Home / Self Care | Payer: Self-pay | Source: Home / Self Care | Attending: Gastroenterology

## 2018-11-04 ENCOUNTER — Ambulatory Visit: Payer: 59 | Admitting: Anesthesiology

## 2018-11-04 DIAGNOSIS — K625 Hemorrhage of anus and rectum: Secondary | ICD-10-CM

## 2018-11-04 DIAGNOSIS — Z6833 Body mass index (BMI) 33.0-33.9, adult: Secondary | ICD-10-CM | POA: Insufficient documentation

## 2018-11-04 DIAGNOSIS — Z79899 Other long term (current) drug therapy: Secondary | ICD-10-CM | POA: Insufficient documentation

## 2018-11-04 DIAGNOSIS — E669 Obesity, unspecified: Secondary | ICD-10-CM | POA: Insufficient documentation

## 2018-11-04 DIAGNOSIS — F17210 Nicotine dependence, cigarettes, uncomplicated: Secondary | ICD-10-CM | POA: Insufficient documentation

## 2018-11-04 HISTORY — PX: COLONOSCOPY WITH PROPOFOL: SHX5780

## 2018-11-04 SURGERY — COLONOSCOPY WITH PROPOFOL
Anesthesia: General

## 2018-11-04 MED ORDER — PROPOFOL 10 MG/ML IV BOLUS
INTRAVENOUS | Status: AC
  Filled 2018-11-04: qty 40

## 2018-11-04 MED ORDER — PROPOFOL 500 MG/50ML IV EMUL
INTRAVENOUS | Status: DC | PRN
  Administered 2018-11-04: 150 ug/kg/min via INTRAVENOUS

## 2018-11-04 MED ORDER — PROPOFOL 10 MG/ML IV BOLUS
INTRAVENOUS | Status: DC | PRN
  Administered 2018-11-04: 60 mg via INTRAVENOUS

## 2018-11-04 MED ORDER — SODIUM CHLORIDE 0.9 % IV SOLN
INTRAVENOUS | Status: DC
  Administered 2018-11-04: 1000 mL via INTRAVENOUS

## 2018-11-04 MED ORDER — LIDOCAINE HCL (PF) 1 % IJ SOLN
INTRAMUSCULAR | Status: AC
  Administered 2018-11-04: 0.3 mL
  Filled 2018-11-04: qty 2

## 2018-11-04 NOTE — Anesthesia Postprocedure Evaluation (Signed)
Anesthesia Post Note  Patient: Calvin Wheeler  Procedure(s) Performed: COLONOSCOPY WITH PROPOFOL (N/A )  Patient location during evaluation: Endoscopy Anesthesia Type: General Level of consciousness: awake and alert Pain management: pain level controlled Vital Signs Assessment: post-procedure vital signs reviewed and stable Respiratory status: spontaneous breathing, nonlabored ventilation, respiratory function stable and patient connected to nasal cannula oxygen Cardiovascular status: blood pressure returned to baseline and stable Postop Assessment: no apparent nausea or vomiting Anesthetic complications: no     Last Vitals:  Vitals:   11/04/18 1029 11/04/18 1049  BP: 107/73 (!) 142/84  Pulse: 98   Resp: 14   Temp: (!) 36.1 C   SpO2: 99%     Last Pain:  Vitals:   11/04/18 1049  TempSrc:   PainSc: 0-No pain                 Wynn Kernes S

## 2018-11-04 NOTE — OR Nursing (Signed)
Pt appears highly upset with the MDs and Staff because he is not cleaned out for the Colonoscopy procedure.  Pt is yelling and cursing at this RN.  Pt refused discharge instructions, and did not want to stay to get all of the post-procedure vital signs.  This RN even called the MD that performed the procedure to let the patient speak with him, and that has upset him even more.  Pt refused to sit in the wheel chair and walked out under his own power.  Pt displayed a steady, even, balanced gait as he and his friend walked out of the unit.

## 2018-11-04 NOTE — Op Note (Signed)
[facility]enterology Patient Name: Calvin Wheeler Procedure Date: 11/04/2018 10:17 AM MRN: [id number] Account #: [id number] Date of Birth: 1961-05-07 Admit Type: Outpatient Age: 58 Room: [facility] ENDO ROOM 4 Gender: Male Note Status: Finalized Procedure:            Colonoscopy Indications:          Rectal bleeding Providers:            Wyline Mood MD, MD Referring MD:         No Local Md, MD (Referring MD) Medicines:            Monitored Anesthesia Care Complications:        No immediate complications. Procedure:            Pre-Anesthesia Assessment:                       - Prior to the procedure, a History and Physical was                        performed, and patient medications, allergies and                        sensitivities were reviewed. The patient's tolerance of                        previous anesthesia was reviewed.                       - The risks and benefits of the procedure and the                        sedation options and risks were discussed with the                        patient. All questions were answered and informed                        consent was obtained.                       - ASA Grade Assessment: II - A patient with mild                        systemic disease.                       After obtaining informed consent, the colonoscope was                        passed under direct vision. Throughout the procedure,                        the patient's blood pressure, pulse, and oxygen                        saturations were monitored continuously. The                        Colonoscope was introduced through the anus with the  intention of advancing to the cecum. The scope was                        advanced to the descending colon before the procedure                        was aborted. Medications were given. The colonoscopy                        was performed with ease. The patient tolerated the                   procedure well. The quality of the bowel preparation                        was poor. Findings:      The perianal and digital rectal examinations were normal.      A large amount of stool was found in the rectum, in the sigmoid colon       and in the descending colon, precluding visualization. Impression:           - Preparation of the colon was poor.                       - Stool in the rectum, in the sigmoid colon and in the                        descending colon.                       - No specimens collected. Recommendation:       - Discharge patient to home (with escort).                       - Resume previous diet.                       - Continue present medications.                       - Repeat colonoscopy in 2 weeks because the bowel                        preparation was suboptimal.                       - Suggest 2 gallons of golytely, inform him thatthe                        color of the effluent should be similar to transparent                        apple juice other wise he is not clean . This is his                        second attempt Procedure Code(s):    --- Professional ---                       [phone], 53, Colonoscopy, flexible; diagnostic, including  collection of specimen(s) by brushing or washing, when                        performed (separate procedure) Diagnosis Code(s):    --- Professional ---                       K62.5, Hemorrhage of anus and rectum CPT copyright 2018 American Medical Association. All rights reserved. The codes documented in this report are preliminary and upon coder review may  be revised to meet current compliance requirements. Wyline MoodKiran Feiga Nadel, MD Wyline MoodKiran Kaelob Persky MD, MD 11/04/2018 10:25:33 AM This report has been signed electronically. Number of Addenda: 0 Note Initiated On: 11/04/2018 10:17 AM Total Procedure Duration: 0 hours 1 minute 6 seconds       [facility]

## 2018-11-04 NOTE — Anesthesia Preprocedure Evaluation (Signed)
Anesthesia Evaluation  Patient identified by MRN, date of birth, ID band Patient awake    Reviewed: Allergy & Precautions, NPO status , Patient's Chart, lab work & pertinent test results, reviewed documented beta blocker date and time   Airway Mallampati: III  TM Distance: >3 FB     Dental  (+) Chipped   Pulmonary Current Smoker,           Cardiovascular      Neuro/Psych    GI/Hepatic   Endo/Other    Renal/GU      Musculoskeletal   Abdominal   Peds  Hematology   Anesthesia Other Findings Obese.Low HRs approx 48  Reproductive/Obstetrics                             Anesthesia Physical Anesthesia Plan  ASA: III  Anesthesia Plan: General   Post-op Pain Management:    Induction: Intravenous  PONV Risk Score and Plan:   Airway Management Planned:   Additional Equipment:   Intra-op Plan:   Post-operative Plan:   Informed Consent: I have reviewed the patients History and Physical, chart, labs and discussed the procedure including the risks, benefits and alternatives for the proposed anesthesia with the patient or authorized representative who has indicated his/her understanding and acceptance.       Plan Discussed with: CRNA  Anesthesia Plan Comments:         Anesthesia Quick Evaluation

## 2018-11-04 NOTE — Transfer of Care (Signed)
Immediate Anesthesia Transfer of Care Note  Patient: Calvin Wheeler  Procedure(s) Performed: COLONOSCOPY WITH PROPOFOL (N/A )  Patient Location: Endoscopy Unit  Anesthesia Type:General  Level of Consciousness: awake, alert  and oriented  Airway & Oxygen Therapy: Patient Spontanous Breathing and Patient connected to nasal cannula oxygen  Post-op Assessment: Report given to RN and Post -op Vital signs reviewed and stable  Post vital signs: Reviewed and stable  Last Vitals:  Vitals Value Taken Time  BP    Temp    Pulse    Resp    SpO2      Last Pain:  Vitals:   11/04/18 0959  PainSc: 0-No pain         Complications: No apparent anesthesia complications

## 2018-11-04 NOTE — H&P (Signed)
Calvin MoodKiran Warrene Kapfer, MD [address], [address], [place], [place], [id number] [address], [address], [place], [place], [address] Phone: [phone]  Fax: [phone]  Primary Care Physician:  Patient, No Pcp Per   Pre-Procedure History & Physical: HPI:  Calvin AdjutantRichard J Wheeler is a 58 y.o. male is here for an colonoscopy.   History reviewed. No pertinent past medical history.  Past Surgical History:  Procedure Laterality Date  . HERNIA REPAIR      Prior to Admission medications   Medication Sig Start Date End Date Taking? Authorizing Provider  albuterol (PROVENTIL HFA;VENTOLIN HFA) 108 (90 Base) MCG/ACT inhaler Inhale 2 puffs into the lungs every 6 (six) hours as needed for wheezing or shortness of breath. 10/15/18  Yes Bridget HartshornSummers, Rhonda L, PA-C  brompheniramine-pseudoephedrine-DM 30-2-10 MG/5ML syrup Take 5 mLs by mouth 4 (four) times daily as needed. 10/15/18   Tommi RumpsSummers, Rhonda L, PA-C  HYDROcodone-ibuprofen (VICOPROFEN) 7.5-200 MG tablet Take by mouth. 04/26/14   [provider]  ibuprofen (ADVIL,MOTRIN) 200 MG tablet Take by mouth.    [provider]  pramoxine (PROCTOFOAM) 1 % foam Place 1 application rectally 3 (three) times daily as needed for anal itching. 10/11/18   Tommi RumpsSummers, Rhonda L, PA-C    Allergies as of 11/03/2018 - Review Complete 11/03/2018  Allergen Reaction Noted  . Sulfa antibiotics Other (See Comments) 10/01/2014    History reviewed. No pertinent family history.  Social History   Socioeconomic History  . Marital status: Single    Spouse name: Not on file  . Number of children: Not on file  . Years of education: Not on file  . Highest education level: Not on file  Occupational History  . Not on file  Social Needs  . Financial resource strain: Not on file  . Food insecurity:    Worry: Not on file    Inability: Not on file  . Transportation needs:    Medical: Not on file    Non-medical: Not on file  Tobacco Use  . Smoking status: Current Some  Day Smoker    Packs/day: 0.50    Types: Cigarettes  . Smokeless tobacco: Never Used  Substance and Sexual Activity  . Alcohol use: No  . Drug use: Never  . Sexual activity: Not on file  Lifestyle  . Physical activity:    Days per week: Not on file    Minutes per session: Not on file  . Stress: Not on file  Relationships  . Social connections:    Talks on phone: Not on file    Gets together: Not on file    Attends religious service: Not on file    Active member of club or organization: Not on file    Attends meetings of clubs or organizations: Not on file    Relationship status: Not on file  . Intimate partner violence:    Fear of current or ex partner: Not on file    Emotionally abused: Not on file    Physically abused: Not on file    Forced sexual activity: Not on file  Other Topics Concern  . Not on file  Social History Narrative  . Not on file    Review of Systems: See HPI, otherwise negative ROS  Physical Exam: There were no vitals taken for this visit. General:   Alert,  pleasant and cooperative in NAD Head:  Normocephalic and atraumatic. Neck:  Supple; no masses or thyromegaly. Lungs:  Clear throughout to auscultation, normal respiratory  effort.    Heart:  +S1, +S2, Regular rate and rhythm, No edema. Abdomen:  Soft, nontender and nondistended. Normal bowel sounds, without guarding, and without rebound.   Neurologic:  Alert and  oriented x4;  grossly normal neurologically.  Impression/Plan: Calvin Wheeler is here for an colonoscopy to be performed for rectal bleeding   Risks, benefits, limitations, and alternatives regarding  colonoscopy have been reviewed with the patient.  Questions have been answered.  All parties agreeable.   Calvin Mood, MD  11/04/2018, 9:58 AM

## 2018-11-04 NOTE — Anesthesia Post-op Follow-up Note (Signed)
Anesthesia QCDR form completed.        

## 2018-11-07 ENCOUNTER — Encounter: Payer: Self-pay | Admitting: Gastroenterology

## 2019-08-10 ENCOUNTER — Other Ambulatory Visit: Payer: Self-pay

## 2019-08-10 ENCOUNTER — Emergency Department: Payer: Self-pay

## 2019-08-10 ENCOUNTER — Emergency Department
Admission: EM | Admit: 2019-08-10 | Discharge: 2019-08-10 | Disposition: A | Discharge status: Home / Self Care | Payer: Self-pay | Attending: Emergency Medicine | Admitting: Emergency Medicine

## 2019-08-10 ENCOUNTER — Encounter: Payer: Self-pay | Admitting: Emergency Medicine

## 2019-08-10 DIAGNOSIS — F17210 Nicotine dependence, cigarettes, uncomplicated: Secondary | ICD-10-CM | POA: Insufficient documentation

## 2019-08-10 DIAGNOSIS — J209 Acute bronchitis, unspecified: Secondary | ICD-10-CM | POA: Insufficient documentation

## 2019-08-10 DIAGNOSIS — H00024 Hordeolum internum left upper eyelid: Secondary | ICD-10-CM | POA: Insufficient documentation

## 2019-08-10 MED ORDER — GUAIFENESIN-CODEINE 100-10 MG/5ML PO SOLN: 5.0000 mL | ORAL | 0 refills | Status: DC | PRN

## 2019-08-10 MED ORDER — PREDNISONE 10 MG PO TABS: ORAL_TABLET | ORAL | 0 refills | Status: DC

## 2019-08-10 MED ORDER — AZITHROMYCIN 250 MG PO TABS: ORAL_TABLET | ORAL | 0 refills | Status: DC

## 2019-08-10 MED ORDER — ALBUTEROL SULFATE HFA 108 (90 BASE) MCG/ACT IN AERS: 2.0000 | INHALATION_SPRAY | Freq: Four times a day (QID) | RESPIRATORY_TRACT | 1 refills | Status: DC | PRN

## 2019-08-10 MED ORDER — GENTAMICIN SULFATE 0.3 % OP OINT: TOPICAL_OINTMENT | Freq: Three times a day (TID) | OPHTHALMIC | 0 refills | Status: DC

## 2019-08-10 NOTE — ED Triage Notes (Signed)
Pt wife Malachy Mood, [phone] in car. Will come in when called.

## 2019-08-10 NOTE — ED Triage Notes (Signed)
Pt reports cough and congestion for the past week. Pt denies fevers and reports took the flu shot a month ago. Pt reports yesterday he woke up and his left eye was crusted over. Pt states this am was worse and now is swollen. Pt states had to use a wet rag this am to get his left eye open.

## 2019-08-10 NOTE — ED Notes (Signed)
See triage note  Presents with swelling to left eyelid for couple of days  Also has had nasal and chest congestion  Denies any fever

## 2019-08-10 NOTE — Discharge Instructions (Signed)
Begin taking medication as directed.  The prednisone and inhaler are to help stop the coughing and help decrease wheezing which in turn will help you breathe.  The antibiotic is for 5 days and will stay in your body for 10 days.  Also the ointment is for your stye.  Also use warm compresses frequently to your eye to help this improve quickly.  Return to the emergency department if any severe worsening of your breathing.  Also consider getting the Covid testing done at the drive-through here at [facility].

## 2019-08-10 NOTE — ED Provider Notes (Signed)
[facility] Emergency Department Provider Note  ____________________________________________   First MD Initiated Contact with Patient 08/10/19 [phone]     (approximate)  I have reviewed the triage vital signs and the nursing notes.   HISTORY  Chief Complaint Cough, Nasal Congestion, and Eye Drainage   HPI Calvin Wheeler is a 58 y.o. male presents with complaint of fever, nasal congestion and cough for the last several days.  Patient also states that he woke up with his left eye crusted over and the upper lid was swollen.  He has been using over-the-counter stye medication as he has had these before.  He is unaware of any fever.  He denies any change in taste or smell.  There is been no GI symptoms.  He denies being exposed to anyone with known Covid.  Patient rates his pain as 10 out of 10.    History reviewed. No pertinent past medical history.  Patient Active Problem List   Diagnosis Date Noted  . Rectal bleeding     Past Surgical History:  Procedure Laterality Date  . COLONOSCOPY WITH PROPOFOL N/A 11/03/2018   Procedure: COLONOSCOPY WITH PROPOFOL;  Surgeon: Toney ReilVanga, Rohini Reddy, MD;  [facility];  Service: Gastroenterology;  Laterality: N/A;  . COLONOSCOPY WITH PROPOFOL N/A 11/04/2018   Procedure: COLONOSCOPY WITH PROPOFOL;  Surgeon: Wyline MoodAnna, Kiran, MD;  [facility];  Service: Gastroenterology;  Laterality: N/A;  . HERNIA REPAIR      Prior to Admission medications   Medication Sig Start Date End Date Taking? Authorizing Provider  albuterol (VENTOLIN HFA) 108 (90 Base) MCG/ACT inhaler Inhale 2 puffs into the lungs every 6 (six) hours as needed for wheezing or shortness of breath. 08/10/19   Tommi RumpsSummers, Rhonda L, PA-C  azithromycin (ZITHROMAX Z-PAK) 250 MG tablet Take 2 tablets (500 mg) on  Day 1,  followed by 1 tablet (250 mg) once daily on Days 2 through 5. 08/10/19   Tommi RumpsSummers, Rhonda L, PA-C  gentamicin (GARAMYCIN) 0.3 % ophthalmic  ointment Place into the left eye 3 (three) times daily. 08/10/19   Tommi RumpsSummers, Rhonda L, PA-C  guaiFENesin-codeine 100-10 MG/5ML syrup Take 5 mLs by mouth every 4 (four) hours as needed for cough. 08/10/19   Tommi RumpsSummers, Rhonda L, PA-C  pramoxine (PROCTOFOAM) 1 % foam Place 1 application rectally 3 (three) times daily as needed for anal itching. 10/11/18   Tommi RumpsSummers, Rhonda L, PA-C  predniSONE (DELTASONE) 10 MG tablet Take 6 tablets  today, on day 2 take 5 tablets, day 3 take 4 tablets, day 4 take 3 tablets, day 5 take  2 tablets and 1 tablet the last day 08/10/19   Tommi RumpsSummers, Rhonda L, PA-C    Allergies Sulfa antibiotics  No family history on file.  Social History Social History   Tobacco Use  . Smoking status: Current Some Day Smoker    Packs/day: 0.50    Types: Cigarettes  . Smokeless tobacco: Never Used  Substance Use Topics  . Alcohol use: No  . Drug use: Never    Review of Systems Constitutional: No fever/chills Eyes: No visual changes.  Positive for left upper eyelid swollen. ENT: Positive for nasal congestion. Cardiovascular: Denies chest pain. Respiratory: Denies shortness of breath.  Positive for cough. Gastrointestinal: No abdominal pain.  No nausea, no vomiting.  No diarrhea.  Musculoskeletal: Negative for muscle aches. Skin: Negative for rash. Neurological: Negative for headaches, focal weakness or numbness. ___________________________________________   PHYSICAL EXAM:  VITAL SIGNS: ED Triage Vitals  Enc  Vitals Group     BP 08/10/19 0829 133/77     Pulse Rate 08/10/19 0829 67     Resp 08/10/19 0829 20     Temp 08/10/19 0829 97.8 F (36.6 C)     Temp Source 08/10/19 0829 Oral     SpO2 08/10/19 0829 96 %     Weight 08/10/19 0828 280 lb (127 kg)     Height 08/10/19 0828 6\' 2"  (1.88 m)     Head Circumference --      Peak Flow --      Pain Score 08/10/19 0827 10     Pain Loc --      Pain Edu? --      Excl. in GC? --    Constitutional: Alert and oriented. Well  appearing and in no acute distress. Eyes: Conjunctivae are normal. PERRL. EOMI. left upper lid has an internal hordeolum present.  No drainage noted at this time. Head: Atraumatic. Nose: Moderate congestion/rhinnorhea. Mouth/Throat: Mucous membranes are moist.  Oropharynx non-erythematous. Neck: No stridor.   Hematological/Lymphatic/Immunilogical: No cervical lymphadenopathy. Cardiovascular: Normal rate, regular rhythm. Grossly normal heart sounds.  Good peripheral circulation. Respiratory: Normal respiratory effort.  No retractions. Lungs CTAB.  Faint expiratory wheezes are heard diffusely throughout.  No accessory muscles or difficulty breathing.  Patient is able to speak in complete sentences without any difficulty. Musculoskeletal: Moves upper and lower extremities without any difficulty.  Patient is in the hallway walking without assistance multiple times. Neurologic:  Normal speech and language. No gross focal neurologic deficits are appreciated. No gait instability. Skin:  Skin is warm, dry and intact. No rash noted. Psychiatric: Mood and affect are normal. Speech and behavior are normal.  ____________________________________________   LABS (all labs ordered are listed, but only abnormal results are displayed)  Labs Reviewed - No data to display ____________________________________________  RADIOLOGY   Official radiology report(s): Dg Chest Portable 1 View  Result Date: 08/10/2019 CLINICAL DATA:  Cough and wheezing EXAM: PORTABLE CHEST 1 VIEW COMPARISON:  October 15, 2018 FINDINGS: No edema or consolidation. Heart is upper normal in size with pulmonary vascularity normal. No adenopathy. No bone lesions. IMPRESSION: No edema or consolidation.  Heart upper normal in size. Electronically Signed   By: October 17, 2018 III M.D.   On: 08/10/2019 09:31    ____________________________________________   PROCEDURES  Procedure(s) performed (including Critical Care):  Procedures   ___________________________________________   INITIAL IMPRESSION / ASSESSMENT AND PLAN / ED COURSE  As part of my medical decision making, I reviewed the following data within the electronic MEDICAL RECORD NUMBER Notes from prior ED visits and [place] Controlled Substance Database  Calvin Wheeler was evaluated in Emergency Department on 08/10/2019 for the symptoms described in the history of present illness. He was evaluated in the context of the global COVID-19 pandemic, which necessitated consideration that the patient might be at risk for infection with the SARS-CoV-2 virus that causes COVID-19. Institutional protocols and algorithms that pertain to the evaluation of patients at risk for COVID-19 are in a state of rapid change based on information released by regulatory bodies including the CDC and federal and state organizations. These policies and algorithms were followed during the patient's care in the ED.  58 year old male presents to the ED with complaint of nasal congestion cough without known Covid exposure and he denies any fever or chills.  Patient also has been using some over-the-counter stye medication for his left upper eyelid which appears not to be working.  Stye was visualized.  Chest x-ray was reassuring.  Patient has a male friend in the room with him who is not registered.  We discussed having a Covid test done and they both plan on going to the free clinic at the visitor entrance for their Covid swabs.  Patient was given a refill of his Ventolin inhaler, prednisone taper, Zithromax, guaifenesin with codeine as needed for cough and gentamicin for his eye.  He is aware that he needs to use warm compresses frequently to his left eyelid.  He is to follow-up with his PCP if any continued problems or return to the emergency department if any severe worsening of his breathing.  ____________________________________________   FINAL CLINICAL IMPRESSION(S) / ED DIAGNOSES  Final diagnoses:   Acute bronchitis, unspecified organism  Hordeolum internum of left upper eyelid     ED Discharge Orders         Ordered    albuterol (VENTOLIN HFA) 108 (90 Base) MCG/ACT inhaler  Every 6 hours PRN     08/10/19 1027    predniSONE (DELTASONE) 10 MG tablet     08/10/19 1027    gentamicin (GARAMYCIN) 0.3 % ophthalmic ointment  3 times daily     08/10/19 1027    azithromycin (ZITHROMAX Z-PAK) 250 MG tablet     08/10/19 1027    guaiFENesin-codeine 100-10 MG/5ML syrup  Every 4 hours PRN     08/10/19 1027           Note:  This document was prepared using Dragon voice recognition software and may include unintentional dictation errors.    Johnn Hai, PA-C 08/10/19 1650    Lavonia Drafts, MD 08/10/19 1651

## 2020-01-01 ENCOUNTER — Other Ambulatory Visit: Payer: Self-pay

## 2020-01-01 ENCOUNTER — Emergency Department
Admission: EM | Admit: 2020-01-01 | Discharge: 2020-01-01 | Disposition: A | Discharge status: Home / Self Care | Payer: Self-pay | Attending: Emergency Medicine | Admitting: Emergency Medicine

## 2020-01-01 ENCOUNTER — Emergency Department: Payer: Self-pay

## 2020-01-01 DIAGNOSIS — F17210 Nicotine dependence, cigarettes, uncomplicated: Secondary | ICD-10-CM | POA: Insufficient documentation

## 2020-01-01 DIAGNOSIS — J069 Acute upper respiratory infection, unspecified: Secondary | ICD-10-CM | POA: Insufficient documentation

## 2020-01-01 DIAGNOSIS — Z20822 Contact with and (suspected) exposure to covid-19: Secondary | ICD-10-CM | POA: Insufficient documentation

## 2020-01-01 LAB — SARS CORONAVIRUS 2 (TAT 6-24 HRS): SARS Coronavirus 2: NEGATIVE

## 2020-01-01 MED ORDER — ONDANSETRON 4 MG PO TBDP
4.0000 mg | ORAL_TABLET | Freq: Once | ORAL | Status: AC
  Administered 2020-01-01: 4 mg via ORAL
  Filled 2020-01-01: qty 1

## 2020-01-01 MED ORDER — IPRATROPIUM-ALBUTEROL 0.5-2.5 (3) MG/3ML IN SOLN
3.0000 mL | Freq: Once | RESPIRATORY_TRACT | Status: AC
  Administered 2020-01-01: 10:00:00 3 mL via RESPIRATORY_TRACT
  Filled 2020-01-01: qty 3

## 2020-01-01 MED ORDER — AZITHROMYCIN 250 MG PO TABS: ORAL_TABLET | ORAL | 0 refills | Status: DC

## 2020-01-01 MED ORDER — ALBUTEROL SULFATE HFA 108 (90 BASE) MCG/ACT IN AERS: 2.0000 | INHALATION_SPRAY | Freq: Four times a day (QID) | RESPIRATORY_TRACT | 0 refills | Status: DC | PRN

## 2020-01-01 MED ORDER — BENZONATATE 100 MG PO CAPS: 100.0000 mg | ORAL_CAPSULE | Freq: Three times a day (TID) | ORAL | 0 refills | Status: AC | PRN

## 2020-01-01 NOTE — ED Notes (Signed)
See triage note  Presents with cold sx's   States he has had cough and congestion for about 2 weeks   Afebrile on arrival   States he has been taking OTC meds for same also has had h/a's and thinks his b/p has been elevated

## 2020-01-01 NOTE — ED Provider Notes (Signed)
[facility] Emergency Department Provider Note  ____________________________________________  Time seen: Approximately 9:04 AM  I have reviewed the triage vital signs and the nursing notes.   HISTORY  Chief Complaint URI    HPI Calvin Wheeler is a 59 y.o. male that presents to the emergency department for evaluation of nasal congestion, nonproductive cough, wheezing for 2 weeks.  No known contacts with COVID-19.  He felt similar this fall and was started on antibiotics.  Patient smoked for "many years" but states that he quit about 1 month ago.  He has been taking OTC cough medication but states he did not take any today.  He would like antibiotics because they have worked before. No CP, vomiting, abdominal pain, diarrhea.   Patient states that his blood pressure has also been elevated for several weeks.  He states that the top number will sometimes be in the 150s.  He has had intermittent headaches for about 1 month. Headache wraps around his head.He does not have a headache currently.   History reviewed. No pertinent past medical history.  Patient Active Problem List   Diagnosis Date Noted  . Rectal bleeding     Past Surgical History:  Procedure Laterality Date  . COLONOSCOPY WITH PROPOFOL N/A 11/03/2018   Procedure: COLONOSCOPY WITH PROPOFOL;  Surgeon: Lin Landsman, MD;  Location: [facility] ENDOSCOPY;  Service: Gastroenterology;  Laterality: N/A;  . COLONOSCOPY WITH PROPOFOL N/A 11/04/2018   Procedure: COLONOSCOPY WITH PROPOFOL;  Surgeon: Jonathon Bellows, MD;  Location: [facility] ENDOSCOPY;  Service: Gastroenterology;  Laterality: N/A;  . HERNIA REPAIR      Prior to Admission medications   Medication Sig Start Date End Date Taking? Authorizing Provider  albuterol (VENTOLIN HFA) 108 (90 Base) MCG/ACT inhaler Inhale 2 puffs into the lungs every 6 (six) hours as needed for wheezing or shortness of breath. 01/01/20   Laban Emperor, PA-C  azithromycin (ZITHROMAX  Z-PAK) 250 MG tablet Take 2 tablets (500 mg) on  Day 1,  followed by 1 tablet (250 mg) once daily on Days 2 through 5. 01/01/20   Laban Emperor, PA-C  benzonatate (TESSALON PERLES) 100 MG capsule Take 1 capsule (100 mg total) by mouth 3 (three) times daily as needed. 01/01/20 12/31/20  Laban Emperor, PA-C    Allergies Sulfa antibiotics  No family history on file.  Social History Social History   Tobacco Use  . Smoking status: Current Some Day Smoker    Packs/day: 0.50    Types: Cigarettes  . Smokeless tobacco: Never Used  Substance Use Topics  . Alcohol use: No  . Drug use: Never     Review of Systems  Constitutional: No fever/chills Eyes: No visual changes. No discharge. ENT: Positive for congestion and rhinorrhea. Cardiovascular: No chest pain. Respiratory: Positive for cough and SOB. Gastrointestinal: No abdominal pain.  No vomiting.  No diarrhea.  No constipation. Musculoskeletal: Negative for musculoskeletal pain. Skin: Negative for rash, abrasions, lacerations, ecchymosis. Neurological: Positive for headaches.   ____________________________________________   PHYSICAL EXAM:  VITAL SIGNS: ED Triage Vitals  Enc Vitals Group     BP 01/01/20 0824 (!) 143/91     Pulse Rate 01/01/20 0824 73     Resp 01/01/20 0824 18     Temp 01/01/20 0826 97.8 F (36.6 C)     Temp Source 01/01/20 0824 Oral     SpO2 01/01/20 0824 98 %     Weight 01/01/20 0825 275 lb (124.7 kg)     Height 01/01/20 0825  6\' 2"  (1.88 m)     Head Circumference --      Peak Flow --      Pain Score 01/01/20 0825 8     Pain Loc --      Pain Edu? --      Excl. in [place]? --      Constitutional: Alert and oriented. Well appearing and in no acute distress. Eyes: Conjunctivae are normal. PERRL. EOMI. No discharge. Head: Atraumatic. ENT: No frontal and maxillary sinus tenderness.      Ears: Tympanic membranes pearly gray with good landmarks. No discharge.      Nose: Mild congestion/rhinnorhea.       Mouth/Throat: Mucous membranes are moist. Oropharynx non-erythematous. Tonsils not enlarged. No exudates. Uvula midline. Neck: No stridor.   Hematological/Lymphatic/Immunilogical: No cervical lymphadenopathy. Cardiovascular: Normal rate, regular rhythm.  Good peripheral circulation. Respiratory: Normal respiratory effort without tachypnea or retractions. Lungs CTAB. Good air entry to the bases with no decreased or absent breath sounds. Gastrointestinal: Bowel sounds 4 quadrants. Soft and nontender to palpation. No guarding or rigidity. No palpable masses. No distention. Musculoskeletal: Full range of motion to all extremities. No gross deformities appreciated. Neurologic:  Normal speech and language. No gross focal neurologic deficits are appreciated.  Skin:  Skin is warm, dry and intact. No rash noted. Psychiatric: Mood and affect are normal. Speech and behavior are normal. Patient exhibits appropriate insight and judgement.   ____________________________________________   LABS (all labs ordered are listed, but only abnormal results are displayed)  Labs Reviewed  SARS CORONAVIRUS 2 (TAT 6-24 HRS)   ____________________________________________  EKG  SB ____________________________________________  RADIOLOGY 01/03/20, personally viewed and evaluated these images (plain radiographs) as part of my medical decision making, as well as reviewing the written report by the radiologist.   DG Chest 2 View  Result Date: 01/01/2020 CLINICAL DATA:  Cough EXAM: CHEST - 2 VIEW COMPARISON:  08/10/2019 FINDINGS: Normal heart size and mediastinal contours. No acute infiltrate or edema. No effusion or pneumothorax. No acute osseous findings. IMPRESSION: Negative chest. Electronically Signed   By: 13/02/2019 M.D.   On: 01/01/2020 08:49   CT Head Wo Contrast  Result Date: 01/01/2020 CLINICAL DATA:  Headache, cough, sinus congestion EXAM: CT HEAD WITHOUT CONTRAST TECHNIQUE: Contiguous  axial images were obtained from the base of the skull through the vertex without intravenous contrast. COMPARISON:  None. FINDINGS: Brain: No evidence of acute infarction, hemorrhage, hydrocephalus, extra-axial collection or mass lesion/mass effect. Vascular: No hyperdense vessel or unexpected calcification. Skull: Normal. Negative for fracture or focal lesion. Sinuses/Orbits: No acute finding. Other: None. IMPRESSION: 1. No acute intracranial pathology. No non-contrast CT findings to explain headache. 2. No abnormality of the included portions of the paranasal sinuses. Electronically Signed   By: 01/03/2020 M.D.   On: 01/01/2020 09:49    ____________________________________________    PROCEDURES  Procedure(s) performed:    Procedures    Medications  ondansetron (ZOFRAN-ODT) disintegrating tablet 4 mg (4 mg Oral Given 01/01/20 0959)  ipratropium-albuterol (DUONEB) 0.5-2.5 (3) MG/3ML nebulizer solution 3 mL (3 mLs Nebulization Given 01/01/20 0959)     ____________________________________________   INITIAL IMPRESSION / ASSESSMENT AND PLAN / ED COURSE  Pertinent labs & imaging results that were available during my care of the patient were reviewed by me and considered in my medical decision making (see chart for details).  Review of the  [place]RS was performed in accordance of the [place] prior to dispensing any controlled drugs.  Patient's diagnosis is consistent with URI. Vital signs and exam are reassuring.  Blood pressure was mildly elevated at 143/91 on arrival.  Blood pressure after recheck was 135/88.  CT head and chest xray are negative for acute abnormalities. Covid test in process. Wheezing and SOB improved with duoneb treatment. Patient appears well and is staying well hydrated. Patient feels comfortable going home. Patient will be discharged home with prescriptions for azithromycin, tessalon Perles, albuterol inhaler. Patient is to follow up with PCP as needed or otherwise  directed. Patient is given ED precautions to return to the ED for any worsening or new symptoms.  Calvin Wheeler was evaluated in Emergency Department on 01/01/2020 for the symptoms described in the history of present illness. He was evaluated in the context of the global COVID-19 pandemic, which necessitated consideration that the patient might be at risk for infection with the SARS-CoV-2 virus that causes COVID-19. Institutional protocols and algorithms that pertain to the evaluation of patients at risk for COVID-19 are in a state of rapid change based on information released by regulatory bodies including the CDC and federal and state organizations. These policies and algorithms were followed during the patient's care in the ED.   ____________________________________________  FINAL CLINICAL IMPRESSION(S) / ED DIAGNOSES  Final diagnoses:  Upper respiratory tract infection, unspecified type      NEW MEDICATIONS STARTED DURING THIS VISIT:  ED Discharge Orders         Ordered    albuterol (VENTOLIN HFA) 108 (90 Base) MCG/ACT inhaler  Every 6 hours PRN     01/01/20 1029    benzonatate (TESSALON PERLES) 100 MG capsule  3 times daily PRN     01/01/20 1029    azithromycin (ZITHROMAX Z-PAK) 250 MG tablet     01/01/20 1029              This chart was dictated using voice recognition software/Dragon. Despite best efforts to proofread, errors can occur which can change the meaning. Any change was purely unintentional.    Enid Derry, PA-C 01/01/20 1511    Minna Antis, MD 01/01/20 [phone]

## 2020-01-01 NOTE — ED Triage Notes (Addendum)
Pt c/o cough with congestion and sinus congestion for the past 2 weeks and has been taking cough syrup and decongestants with no relief, states he was given an abx 2 years ago for the same thing and it cleared up.

## 2020-02-13 ENCOUNTER — Emergency Department: Payer: Self-pay

## 2020-02-13 ENCOUNTER — Encounter: Payer: Self-pay | Admitting: Emergency Medicine

## 2020-02-13 ENCOUNTER — Emergency Department
Admission: EM | Admit: 2020-02-13 | Discharge: 2020-02-13 | Disposition: A | Discharge status: Home / Self Care | Payer: Self-pay | Attending: Emergency Medicine | Admitting: Emergency Medicine

## 2020-02-13 ENCOUNTER — Other Ambulatory Visit: Payer: Self-pay

## 2020-02-13 DIAGNOSIS — F17210 Nicotine dependence, cigarettes, uncomplicated: Secondary | ICD-10-CM | POA: Insufficient documentation

## 2020-02-13 DIAGNOSIS — J40 Bronchitis, not specified as acute or chronic: Secondary | ICD-10-CM | POA: Insufficient documentation

## 2020-02-13 LAB — BASIC METABOLIC PANEL
Anion gap: 8 (ref 5–15)
BUN: 8 mg/dL (ref 6–20)
CO2: 26 mmol/L (ref 22–32)
Calcium: 8.8 mg/dL — ABNORMAL LOW (ref 8.9–10.3)
Chloride: 107 mmol/L (ref 98–111)
Creatinine, Ser: 1.14 mg/dL (ref 0.61–1.24)
GFR calc Af Amer: >60 mL/min (ref >60)
GFR calc non Af Amer: >60 mL/min (ref >60)
Glucose, Bld: 119 mg/dL — ABNORMAL HIGH (ref 70–99)
Potassium: 4.1 mmol/L (ref 3.5–5.1)
Sodium: 141 mmol/L (ref 135–145)

## 2020-02-13 LAB — CBC
HCT: 41.1 % (ref 39.0–52.0)
Hemoglobin: 13.9 g/dL (ref 13.0–17.0)
MCH: 30.1 pg (ref 26.0–34.0)
MCHC: 33.8 g/dL (ref 30.0–36.0)
MCV: 89 fL (ref 80.0–100.0)
Platelets: 246 10*3/uL (ref 150–400)
RBC: 4.62 MIL/uL (ref 4.22–5.81)
RDW: 14.7 % (ref 11.5–15.5)
WBC: 6.6 10*3/uL (ref 4.0–10.5)
nRBC: 0 % (ref 0.0–0.2)

## 2020-02-13 LAB — TROPONIN I (HIGH SENSITIVITY): Troponin I (High Sensitivity): 4 ng/L (ref <18)

## 2020-02-13 MED ORDER — IPRATROPIUM-ALBUTEROL 0.5-2.5 (3) MG/3ML IN SOLN
3.0000 mL | Freq: Once | RESPIRATORY_TRACT | Status: AC
  Administered 2020-02-13: 3 mL via RESPIRATORY_TRACT
  Filled 2020-02-13: qty 3

## 2020-02-13 MED ORDER — PREDNISONE 20 MG PO TABS
60.0000 mg | ORAL_TABLET | Freq: Once | ORAL | Status: AC
  Administered 2020-02-13: 60 mg via ORAL
  Filled 2020-02-13: qty 3

## 2020-02-13 MED ORDER — PREDNISONE 10 MG (21) PO TBPK: ORAL_TABLET | Freq: Every day | ORAL | 0 refills | Status: DC

## 2020-02-13 NOTE — ED Triage Notes (Signed)
Pt reports intermittent sharp pain in his chest and mucous when he coughs. Pt reports this happens intermittently and since it is happening again he thought he needed to be see.

## 2020-02-13 NOTE — ED Provider Notes (Signed)
[facility] Emergency Department Provider Note       Time seen: ----------------------------------------- 12:22 PM on 02/13/2020 -----------------------------------------  I have reviewed the triage vital signs and the nursing notes.  HISTORY   Chief Complaint Chest Pain and Nasal Congestion    HPI Calvin Wheeler is a 59 y.o. male with a history of rectal bleeding who presents to the ED for intermittent chest pain with mucus when he coughs.  Patient reports this happens intermittently and since it is happening again he thought he needed to be seen.  He had symptoms like this a month and a half ago and was given antibiotics.  Patient states he feels like he needs more antibiotics.  History reviewed. No pertinent past medical history.  Patient Active Problem List   Diagnosis Date Noted  . Rectal bleeding     Past Surgical History:  Procedure Laterality Date  . COLONOSCOPY WITH PROPOFOL N/A 11/03/2018   Procedure: COLONOSCOPY WITH PROPOFOL;  Surgeon: Lin Landsman, MD;  Location: [facility] ENDOSCOPY;  Service: Gastroenterology;  Laterality: N/A;  . COLONOSCOPY WITH PROPOFOL N/A 11/04/2018   Procedure: COLONOSCOPY WITH PROPOFOL;  Surgeon: Jonathon Bellows, MD;  Location: [facility] ENDOSCOPY;  Service: Gastroenterology;  Laterality: N/A;  . HERNIA REPAIR      Allergies Sulfa antibiotics  Social History Social History   Tobacco Use  . Smoking status: Current Some Day Smoker    Packs/day: 0.50    Types: Cigarettes  . Smokeless tobacco: Never Used  Substance Use Topics  . Alcohol use: No  . Drug use: Never    Review of Systems Constitutional: Negative for fever. Cardiovascular: Positive for chest pain Respiratory: Positive for cough Gastrointestinal: Negative for abdominal pain, vomiting and diarrhea. Musculoskeletal: Negative for back pain. Skin: Negative for rash. Neurological: Negative for headaches, focal weakness or numbness.  All systems  negative/normal/unremarkable except as stated in the HPI  ____________________________________________   PHYSICAL EXAM:  VITAL SIGNS: ED Triage Vitals  Enc Vitals Group     BP 02/13/20 0933 (!) 142/87     Pulse Rate 02/13/20 0933 60     Resp 02/13/20 0933 20     Temp 02/13/20 0933 98.2 F (36.8 C)     Temp Source 02/13/20 0933 Oral     SpO2 02/13/20 0933 98 %     Weight 02/13/20 0934 275 lb (124.7 kg)     Height 02/13/20 0934 6\' 2"  (1.88 m)     Head Circumference --      Peak Flow --      Pain Score --      Pain Loc --      Pain Edu? --      Excl. in [place]? --     Constitutional: Alert and oriented. Well appearing and in no distress. Eyes: Conjunctivae are normal. Normal extraocular movements. Cardiovascular: Normal rate, regular rhythm. No murmurs, rubs, or gallops. Respiratory: Mild bilateral wheezing is noted Gastrointestinal: Soft and nontender. Normal bowel sounds Musculoskeletal: Nontender with normal range of motion in extremities. No lower extremity tenderness nor edema. Neurologic:  Normal speech and language. No gross focal neurologic deficits are appreciated.  Skin:  Skin is warm, dry and intact. No rash noted. Psychiatric: Mood and affect are normal. Speech and behavior are normal.  ____________________________________________  EKG: Interpreted by me.  Sinus rhythm with rate of 60 bpm, normal PR interval, normal QRS width, normal QT  ____________________________________________  ED COURSE:  As part of my medical decision making, I reviewed  the following data within the electronic MEDICAL RECORD NUMBER History obtained from family if available, nursing notes, old chart and ekg, as well as notes from prior ED visits. Patient presented for chest pain and cough, we will assess with labs and imaging as indicated at this time.   Procedures  Calvin Wheeler was evaluated in Emergency Department on 02/13/2020 for the symptoms described in the history of present illness.  He was evaluated in the context of the global COVID-19 pandemic, which necessitated consideration that the patient might be at risk for infection with the SARS-CoV-2 virus that causes COVID-19. Institutional protocols and algorithms that pertain to the evaluation of patients at risk for COVID-19 are in a state of rapid change based on information released by regulatory bodies including the CDC and federal and state organizations. These policies and algorithms were followed during the patient's care in the ED.  ____________________________________________   LABS (pertinent positives/negatives)  Labs Reviewed  BASIC METABOLIC PANEL - Abnormal; Notable for the following components:      Result Value   Glucose, Bld 119 (*)    Calcium 8.8 (*)    All other components within normal limits  CBC  TROPONIN I (HIGH SENSITIVITY)  TROPONIN I (HIGH SENSITIVITY)    RADIOLOGY  Chest x-ray is unremarkable  ____________________________________________   DIFFERENTIAL DIAGNOSIS   Bronchitis, pneumonia, COVID-19, COPD, asthma  FINAL ASSESSMENT AND PLAN  Bronchitis   Plan: The patient had presented for symptoms of bronchitis. Patient's labs were reassuring. Patient's imaging did not reveal any acute process.  He was given a DuoNeb and steroids.  He is encouraged to continue using his inhaler and we will place him on a steroid taper.   Ulice Dash, MD    Note: This note was generated in part or whole with voice recognition software. Voice recognition is usually quite accurate but there are transcription errors that can and very often do occur. I apologize for any typographical errors that were not detected and corrected.     Emily Filbert, MD 02/13/20 1225

## 2020-04-17 ENCOUNTER — Telehealth: Payer: Self-pay | Admitting: General Practice

## 2020-04-17 NOTE — Telephone Encounter (Signed)
Individual has been contacted 3+ times regarding ED referral. No further attempts to contact individual will be made. 

## 2021-01-28 ENCOUNTER — Other Ambulatory Visit: Payer: Self-pay

## 2021-01-28 ENCOUNTER — Emergency Department
Admission: EM | Admit: 2021-01-28 | Discharge: 2021-01-28 | Disposition: A | Discharge status: Home / Self Care | Payer: Self-pay | Attending: Emergency Medicine | Admitting: Emergency Medicine

## 2021-01-28 ENCOUNTER — Emergency Department: Payer: Self-pay

## 2021-01-28 DIAGNOSIS — R140 Abdominal distension (gaseous): Secondary | ICD-10-CM

## 2021-01-28 DIAGNOSIS — K4020 Bilateral inguinal hernia, without obstruction or gangrene, not specified as recurrent: Secondary | ICD-10-CM | POA: Insufficient documentation

## 2021-01-28 DIAGNOSIS — F17210 Nicotine dependence, cigarettes, uncomplicated: Secondary | ICD-10-CM | POA: Insufficient documentation

## 2021-01-28 LAB — URINALYSIS, COMPLETE (UACMP) WITH MICROSCOPIC
Bacteria, UA: NONE SEEN
Bilirubin Urine: NEGATIVE
Glucose, UA: NEGATIVE mg/dL
Hgb urine dipstick: NEGATIVE
Ketones, ur: NEGATIVE mg/dL
Nitrite: NEGATIVE
Protein, ur: NEGATIVE mg/dL
Specific Gravity, Urine: 1.023 (ref 1.005–1.030)
pH: 6 (ref 5.0–8.0)

## 2021-01-28 LAB — CBC
HCT: 43.4 % (ref 39.0–52.0)
Hemoglobin: 14.2 g/dL (ref 13.0–17.0)
MCH: 29.5 pg (ref 26.0–34.0)
MCHC: 32.7 g/dL (ref 30.0–36.0)
MCV: 90 fL (ref 80.0–100.0)
Platelets: 251 10*3/uL (ref 150–400)
RBC: 4.82 MIL/uL (ref 4.22–5.81)
RDW: 14.7 % (ref 11.5–15.5)
WBC: 7.4 10*3/uL (ref 4.0–10.5)
nRBC: 0 % (ref 0.0–0.2)

## 2021-01-28 LAB — COMPREHENSIVE METABOLIC PANEL
ALT: 22 U/L (ref 0–44)
AST: 25 U/L (ref 15–41)
Albumin: 3.5 g/dL (ref 3.5–5.0)
Alkaline Phosphatase: 58 U/L (ref 38–126)
Anion gap: 9 (ref 5–15)
BUN: 12 mg/dL (ref 6–20)
CO2: 25 mmol/L (ref 22–32)
Calcium: 8.8 mg/dL — ABNORMAL LOW (ref 8.9–10.3)
Chloride: 104 mmol/L (ref 98–111)
Creatinine, Ser: 1.02 mg/dL (ref 0.61–1.24)
GFR, Estimated: >60 mL/min (ref >60)
Glucose, Bld: 162 mg/dL — ABNORMAL HIGH (ref 70–99)
Potassium: 3.8 mmol/L (ref 3.5–5.1)
Sodium: 138 mmol/L (ref 135–145)
Total Bilirubin: 0.4 mg/dL (ref 0.3–1.2)
Total Protein: 6.9 g/dL (ref 6.5–8.1)

## 2021-01-28 LAB — LIPASE, BLOOD: Lipase: 32 U/L (ref 11–51)

## 2021-01-28 MED ORDER — ONDANSETRON 4 MG PO TBDP
4.0000 mg | ORAL_TABLET | Freq: Once | ORAL | Status: AC
  Administered 2021-01-28: 4 mg via ORAL
  Filled 2021-01-28: qty 1

## 2021-01-28 MED ORDER — IOHEXOL 9 MG/ML PO SOLN
500.0000 mL | ORAL | Status: AC
  Administered 2021-01-28: 500 mL via ORAL

## 2021-01-28 MED ORDER — IOHEXOL 300 MG/ML  SOLN
100.0000 mL | Freq: Once | INTRAMUSCULAR | Status: AC | PRN
  Administered 2021-01-28: 100 mL via INTRAVENOUS

## 2021-01-28 NOTE — ED Triage Notes (Signed)
Pt c/o bloated feeling in abd with pressure for several months, denies N/V/D or constipation.

## 2021-01-28 NOTE — ED Notes (Signed)
Pt finished PO contrast at 0925, CT notified.

## 2021-01-28 NOTE — ED Notes (Signed)
ED Provider at bedside. 

## 2021-01-28 NOTE — ED Provider Notes (Signed)
[facility] Emergency Department Provider Note ____________________________________________   Event Date/Time   First MD Initiated Contact with Patient 01/28/21 [phone]     (approximate)  I have reviewed the triage vital signs and the nursing notes.   HISTORY  Chief Complaint Abdominal Pain    HPI Calvin Wheeler is a 60 y.o. male experiencing crampy bloated abdominal feeling for about 2 to 3 months  Patient reports that he will feel very bloated in the mornings, he feels like he has a lot of gas.  He has had a normal bowel movement daily including yesterday.   No chest pain no headache.  No fevers or chills.  Some very slight nausea.  No trouble breathing.  Reports the symptoms have been going on for about 2 to 3 months he feels very bloated all the time.  Minimal alcohol use.  He is a smoker, but reports he is working to discontinue  Currently reports discomfort and a feeling of being very bloated across his lower abdomen.  Reports that is not really painful just more of a bloated feeling.  No pain in the back.  History reviewed. No pertinent past medical history.  Patient Active Problem List   Diagnosis Date Noted  . Rectal bleeding     Past Surgical History:  Procedure Laterality Date  . COLONOSCOPY WITH PROPOFOL N/A 11/03/2018   Procedure: COLONOSCOPY WITH PROPOFOL;  Surgeon: Toney Reil, MD;  Location: [facility] ENDOSCOPY;  Service: Gastroenterology;  Laterality: N/A;  . COLONOSCOPY WITH PROPOFOL N/A 11/04/2018   Procedure: COLONOSCOPY WITH PROPOFOL;  Surgeon: Wyline Mood, MD;  Location: [facility] ENDOSCOPY;  Service: Gastroenterology;  Laterality: N/A;  . HERNIA REPAIR      Prior to Admission medications   Medication Sig Start Date End Date Taking? Authorizing Provider  albuterol (VENTOLIN HFA) 108 (90 Base) MCG/ACT inhaler Inhale 2 puffs into the lungs every 6 (six) hours as needed for wheezing or shortness of breath. 01/01/20   Enid Derry,  PA-C  azithromycin (ZITHROMAX Z-PAK) 250 MG tablet Take 2 tablets (500 mg) on  Day 1,  followed by 1 tablet (250 mg) once daily on Days 2 through 5. 01/01/20   Enid Derry, PA-C  predniSONE (STERAPRED UNI-PAK 21 TAB) 10 MG (21) TBPK tablet Take by mouth daily. Dispense steroid taper pack as directed 02/13/20   Emily Filbert, MD    Allergies Sulfa antibiotics  No family history on file.  Social History Social History   Tobacco Use  . Smoking status: Current Some Day Smoker    Packs/day: 0.50    Types: Cigarettes  . Smokeless tobacco: Never Used  Vaping Use  . Vaping Use: Never used  Substance Use Topics  . Alcohol use: No  . Drug use: Never    Review of Systems Constitutional: No fever/chills ENT: No sore throat. Cardiovascular: Denies chest pain. Respiratory: Denies shortness of breath. Gastrointestinal: No abdominal pain.  Rather reports is a bloated gassy feeling not really pain.  Slight nausea no vomiting Genitourinary: Negative for dysuria. Musculoskeletal: Negative for back pain. Skin: Negative for rash. Neurological: Negative for headaches, areas of focal weakness or numbness.    ____________________________________________   PHYSICAL EXAM:  VITAL SIGNS: ED Triage Vitals  Enc Vitals Group     BP 01/28/21 0827 (!) 134/93     Pulse Rate 01/28/21 0827 75     Resp 01/28/21 0827 18     Temp 01/28/21 0827 98 F (36.7 C)  Temp Source 01/28/21 0827 Oral     SpO2 01/28/21 0827 98 %     Weight 01/28/21 0824 275 lb (124.7 kg)     Height 01/28/21 0824 6\' 2"  (1.88 m)     Head Circumference --      Peak Flow --      Pain Score 01/28/21 0824 10     Pain Loc --      Pain Edu? --      Excl. in GC? --     Constitutional: Alert and oriented. Well appearing and in no acute distress.  Very pleasant.  Nontoxic in appearance. Eyes: Conjunctivae are normal. Head: Atraumatic. Nose: No congestion/rhinnorhea. Mouth/Throat: Mucous membranes are moist. Neck: No  stridor.  Cardiovascular: Normal rate, regular rhythm. Grossly normal heart sounds.  Good peripheral circulation. Respiratory: Normal respiratory effort.  No retractions. Lungs CTAB. Gastrointestinal: Soft and abdominal obesity, but no distention.  Abdomen is soft in all quadrants, no large masses are palpable.  He does report not pain but of increased feeling of bloating to palpation up especially across his lower abdomen bilaterally, perhaps some slight discomfort or pain across the lower quadrants.  No epigastric or upper abdominal discomfort.  Negative Murphy. Musculoskeletal: No lower extremity tenderness nor edema. Neurologic:  Normal speech and language. No gross focal neurologic deficits are appreciated.  Skin:  Skin is warm, dry and intact. No rash noted. Psychiatric: Mood and affect are normal. Speech and behavior are normal.  ____________________________________________   LABS (all labs ordered are listed, but only abnormal results are displayed)  Labs Reviewed  COMPREHENSIVE METABOLIC PANEL - Abnormal; Notable for the following components:      Result Value   Glucose, Bld 162 (*)    Calcium 8.8 (*)    All other components within normal limits  URINALYSIS, COMPLETE (UACMP) WITH MICROSCOPIC - Abnormal; Notable for the following components:   Color, Urine YELLOW (*)    APPearance CLEAR (*)    Leukocytes,Ua TRACE (*)    All other components within normal limits  LIPASE, BLOOD  CBC   ____________________________________________  EKG  Reviewed inter by me at 855 Heart rate 79 QRS 99 QTc 440 Normal sinus rhythm, no evidence of ischemia or ectopy ____________________________________________  RADIOLOGY  CT ABDOMEN PELVIS W CONTRAST  Result Date: 01/28/2021 CLINICAL DATA:  Acute generalized abdominal pain. EXAM: CT ABDOMEN AND PELVIS WITH CONTRAST TECHNIQUE: Multidetector CT imaging of the abdomen and pelvis was performed using the standard protocol following bolus  administration of intravenous contrast. CONTRAST:  01/30/2021 OMNIPAQUE IOHEXOL 300 MG/ML  SOLN COMPARISON:  September 17, 2018. FINDINGS: Lower chest: No acute abnormality. Hepatobiliary: No gallstones or biliary dilatation is noted. Several small hepatic cysts are noted. Pancreas: Unremarkable. No pancreatic ductal dilatation or surrounding inflammatory changes. Spleen: Normal in size without focal abnormality. Adrenals/Urinary Tract: Adrenal glands are unremarkable. Except for small bilateral renal cysts, kidneys are normal, without renal calculi, focal lesion, or hydronephrosis. Bladder is unremarkable. Stomach/Bowel: Stomach is within normal limits. Appendix appears normal. No evidence of bowel wall thickening, distention, or inflammatory changes. Vascular/Lymphatic: Aortic atherosclerosis. No enlarged abdominal or pelvic lymph nodes. Reproductive: Mild prostatic enlargement is noted. Other: Small bilateral fat containing inguinal hernias are noted. No ascites is noted. Musculoskeletal: No acute or significant osseous findings. IMPRESSION: Small bilateral fat containing inguinal hernias are noted. No other significant abnormality seen in the abdomen or pelvis. Aortic Atherosclerosis (ICD10-I70.0). Electronically Signed   By: September 19, 2018 M.D.   On: 01/28/2021 10:57  Imaging reviewed, discussed with the patient results above including small fat-containing inguinal hernias.  No acute intra-abdominal findings that I think would explain the patient's symptoms. ____________________________________________   PROCEDURES  Procedure(s) performed: None  Procedures  Critical Care performed: No  ____________________________________________   INITIAL IMPRESSION / ASSESSMENT AND PLAN / ED COURSE  Pertinent labs & imaging results that were available during my care of the patient were reviewed by me and considered in my medical decision making (see chart for details).   Differential diagnosis includes but  is not limited to, abdominal perforation, aortic dissection, cholecystitis, appendicitis, diverticulitis, colitis, esophagitis/gastritis, kidney stone, pyelonephritis, urinary tract infection, aortic aneurysm. All are considered in decision and treatment plan. Based upon the patient's presentation and risk factors, relatively nontoxic very reassuring exam, however given his age history of being a smoker, and his presentation today we discussed risks and benefits of CT scan we will proceed with a CT to evaluate for acute pathology, also evaluate for further anatomy such as abdominal aneurysm etc.  ----------------------------------------- 11:17 AM on 01/28/2021 -----------------------------------------  Patient resting comfortably.  Reassured him of the CT findings.  I did recommend he follow-up with gastroenterology, he is agreeable to this.  We also discussed that he will trial over-the-counter anti-gas medication such as simethicone or use of Beano, avoid dairy products and legumes for now.  Discussed careful return precautions.  Also discussed his inguinal hernias, they do not appear to be acutely symptomatic, but we did discuss recommendation he may follow-up with his primary and general surgery regarding this.  Return precautions and treatment recommendations and follow-up discussed with the patient who is agreeable with the plan.        ____________________________________________   FINAL CLINICAL IMPRESSION(S) / ED DIAGNOSES  Final diagnoses:  Abdominal bloating  Bilateral inguinal hernia without obstruction or gangrene, recurrence not specified        Note:  This document was prepared using Dragon voice recognition software and may include unintentional dictation errors       Sharyn Creamer, MD 01/28/21 1117

## 2021-01-28 NOTE — ED Notes (Signed)
Pt calm , collective , denied pain or sob  

## 2021-03-20 ENCOUNTER — Emergency Department: Payer: Self-pay

## 2021-03-20 ENCOUNTER — Emergency Department
Admission: EM | Admit: 2021-03-20 | Discharge: 2021-03-20 | Disposition: A | Discharge status: Home / Self Care | Payer: Self-pay | Attending: Emergency Medicine | Admitting: Emergency Medicine

## 2021-03-20 ENCOUNTER — Other Ambulatory Visit: Payer: Self-pay

## 2021-03-20 DIAGNOSIS — F17210 Nicotine dependence, cigarettes, uncomplicated: Secondary | ICD-10-CM | POA: Insufficient documentation

## 2021-03-20 DIAGNOSIS — R609 Edema, unspecified: Secondary | ICD-10-CM | POA: Insufficient documentation

## 2021-03-20 LAB — BASIC METABOLIC PANEL
Anion gap: 7 (ref 5–15)
BUN: 13 mg/dL (ref 6–20)
CO2: 25 mmol/L (ref 22–32)
Calcium: 8.2 mg/dL — ABNORMAL LOW (ref 8.9–10.3)
Chloride: 106 mmol/L (ref 98–111)
Creatinine, Ser: 0.97 mg/dL (ref 0.61–1.24)
GFR, Estimated: >60 mL/min (ref >60)
Glucose, Bld: 118 mg/dL — ABNORMAL HIGH (ref 70–99)
Potassium: 4.2 mmol/L (ref 3.5–5.1)
Sodium: 138 mmol/L (ref 135–145)

## 2021-03-20 LAB — HEPATIC FUNCTION PANEL
ALT: 21 U/L (ref 0–44)
AST: 19 U/L (ref 15–41)
Albumin: 3.1 g/dL — ABNORMAL LOW (ref 3.5–5.0)
Alkaline Phosphatase: 52 U/L (ref 38–126)
Bilirubin, Direct: <0.1 mg/dL (ref 0.0–0.2)
Total Bilirubin: 0.6 mg/dL (ref 0.3–1.2)
Total Protein: 6 g/dL — ABNORMAL LOW (ref 6.5–8.1)

## 2021-03-20 LAB — CBC
HCT: 39.5 % (ref 39.0–52.0)
Hemoglobin: 13.2 g/dL (ref 13.0–17.0)
MCH: 31 pg (ref 26.0–34.0)
MCHC: 33.4 g/dL (ref 30.0–36.0)
MCV: 92.7 fL (ref 80.0–100.0)
Platelets: 274 10*3/uL (ref 150–400)
RBC: 4.26 MIL/uL (ref 4.22–5.81)
RDW: 14.6 % (ref 11.5–15.5)
WBC: 8.5 10*3/uL (ref 4.0–10.5)
nRBC: 0 % (ref 0.0–0.2)

## 2021-03-20 LAB — BRAIN NATRIURETIC PEPTIDE: B Natriuretic Peptide: 10.7 pg/mL (ref 0.0–100.0)

## 2021-03-20 MED ORDER — FUROSEMIDE 40 MG PO TABS
40.0000 mg | ORAL_TABLET | Freq: Once | ORAL | Status: AC
  Administered 2021-03-20: 40 mg via ORAL
  Filled 2021-03-20: qty 1

## 2021-03-20 MED ORDER — HYDROCHLOROTHIAZIDE 12.5 MG PO CAPS: 12.5000 mg | ORAL_CAPSULE | Freq: Every day | ORAL | 0 refills | Status: DC

## 2021-03-20 MED ORDER — HYDROCHLOROTHIAZIDE 12.5 MG PO CAPS: 25.0000 mg | ORAL_CAPSULE | Freq: Every day | ORAL | 0 refills | Status: DC

## 2021-03-20 NOTE — Discharge Instructions (Addendum)
It is very important that you follow-up with a primary doctor.  I provided to local clinics, or you can call any nearby primary care doctor.  We are going to start you on hydrochlorothiazide.  Take 12.5 mg capsule daily.  This will help with swelling and high blood pressure.  When starting this, it is very important that you have repeat lab work drawn in the next 1 to 2 weeks.  Do not continue taking this if you do not set up an appointment for lab work.  If swelling does not improve after 1 to 2 days, you can increase your dose to 25 mg, or 2 capsules, daily until swelling improves then go back to 1 capsule.

## 2021-03-20 NOTE — ED Triage Notes (Signed)
Pt c/o BL LE swelling for the past week, states it started in his ankle and now its in his feet also.. pt is ambulatory to triage with a steady gait, no distress noted at this time

## 2021-03-20 NOTE — ED Provider Notes (Signed)
[facility] Emergency Department Provider Note  ____________________________________________   Event Date/Time   First MD Initiated Contact with Patient 03/20/21 [phone]     (approximate)  I have reviewed the triage vital signs and the nursing notes.   HISTORY  Chief Complaint Leg Swelling    HPI Calvin Wheeler is a 60 y.o. male  here with leg swelling. Pt reports that for the past week, he's had progressively worsening bilateral leg swelling. Swelling has been constant, with mild but not complete improvement when lying flat. Swelling is bilateral. Not painful until last 24 hours, when his legs now feel "tight." No trauma. No numbness, weakness. No h/o edema, CHF, renal failure. No other complaints. No fever, chills. Sx worsen when he is walking or on his feet more than usual. No redness.         History reviewed. No pertinent past medical history.  Patient Active Problem List   Diagnosis Date Noted   Rectal bleeding     Past Surgical History:  Procedure Laterality Date   COLONOSCOPY WITH PROPOFOL N/A 11/03/2018   Procedure: COLONOSCOPY WITH PROPOFOL;  Surgeon: Toney Reil, MD;  Location: [facility] ENDOSCOPY;  Service: Gastroenterology;  Laterality: N/A;   COLONOSCOPY WITH PROPOFOL N/A 11/04/2018   Procedure: COLONOSCOPY WITH PROPOFOL;  Surgeon: Wyline Mood, MD;  Location: [facility] ENDOSCOPY;  Service: Gastroenterology;  Laterality: N/A;   HERNIA REPAIR      Prior to Admission medications   Medication Sig Start Date End Date Taking? Authorizing Provider  albuterol (VENTOLIN HFA) 108 (90 Base) MCG/ACT inhaler Inhale 2 puffs into the lungs every 6 (six) hours as needed for wheezing or shortness of breath. 01/01/20   Enid Derry, PA-C  azithromycin (ZITHROMAX Z-PAK) 250 MG tablet Take 2 tablets (500 mg) on  Day 1,  followed by 1 tablet (250 mg) once daily on Days 2 through 5. 01/01/20   Enid Derry, PA-C  hydrochlorothiazide (MICROZIDE) 12.5 MG  capsule Take 1 capsule (12.5 mg total) by mouth daily. 03/20/21 04/19/21  Shaune Pollack, MD  predniSONE (STERAPRED UNI-PAK 21 TAB) 10 MG (21) TBPK tablet Take by mouth daily. Dispense steroid taper pack as directed 02/13/20   Emily Filbert, MD    Allergies Sulfa antibiotics  No family history on file.  Social History Social History   Tobacco Use   Smoking status: Some Days    Packs/day: 0.50    Pack years: 0.00    Types: Cigarettes   Smokeless tobacco: Never  Vaping Use   Vaping Use: Never used  Substance Use Topics   Alcohol use: No   Drug use: Never    Review of Systems  Review of Systems  Constitutional:  Negative for chills and fever.  HENT:  Negative for sore throat.   Respiratory:  Negative for shortness of breath.   Cardiovascular:  Positive for leg swelling. Negative for chest pain.  Gastrointestinal:  Negative for abdominal pain.  Genitourinary:  Negative for flank pain.  Musculoskeletal:  Negative for neck pain.  Skin:  Negative for rash and wound.  Allergic/Immunologic: Negative for immunocompromised state.  Neurological:  Negative for weakness and numbness.  Hematological:  Does not bruise/bleed easily.  All other systems reviewed and are negative.   ____________________________________________  PHYSICAL EXAM:      VITAL SIGNS: ED Triage Vitals [03/20/21 0849]  Enc Vitals Group     BP 131/75     Pulse Rate 99     Resp 18  Temp 98.7 F (37.1 C)     Temp Source Oral     SpO2 100 %     Weight      Height      Head Circumference      Peak Flow      Pain Score      Pain Loc      Pain Edu?      Excl. in GC?      Physical Exam Vitals and nursing note reviewed.  Constitutional:      General: He is not in acute distress.    Appearance: He is well-developed.  HENT:     Head: Normocephalic and atraumatic.  Eyes:     Conjunctiva/sclera: Conjunctivae normal.  Cardiovascular:     Rate and Rhythm: Normal rate and regular rhythm.      Heart sounds: Normal heart sounds.  Pulmonary:     Effort: Pulmonary effort is normal. No respiratory distress.     Breath sounds: No wheezing.  Abdominal:     General: There is no distension.  Musculoskeletal:     Cervical back: Neck supple.     Right lower leg: Edema (1+ pitting) present.     Left lower leg: Edema (1+ pitting) present.  Skin:    General: Skin is warm.     Capillary Refill: Capillary refill takes less than 2 seconds.     Findings: No rash.  Neurological:     Mental Status: He is alert and oriented to person, place, and time.     Motor: No abnormal muscle tone.      ____________________________________________   LABS (all labs ordered are listed, but only abnormal results are displayed)  Labs Reviewed  BASIC METABOLIC PANEL - Abnormal; Notable for the following components:      Result Value   Glucose, Bld 118 (*)    Calcium 8.2 (*)    All other components within normal limits  HEPATIC FUNCTION PANEL - Abnormal; Notable for the following components:   Total Protein 6.0 (*)    Albumin 3.1 (*)    All other components within normal limits  CBC  BRAIN NATRIURETIC PEPTIDE    ____________________________________________  EKG:  ________________________________________  RADIOLOGY All imaging, including plain films, CT scans, and ultrasounds, independently reviewed by me, and interpretations confirmed via formal radiology reads.  ED MD interpretation:   Chest x-ray: No acute disease  Official radiology report(s): DG Chest 2 View  Result Date: 03/20/2021 CLINICAL DATA:  Cough. EXAM: CHEST - 2 VIEW COMPARISON:  Feb 13, 2020. FINDINGS: The heart size and mediastinal contours are within normal limits. Both lungs are clear. No visible pleural effusions or pneumothorax. Biapical pleuroparenchymal thickening. No acute osseous abnormality. IMPRESSION: No active cardiopulmonary disease. Electronically Signed   By: Feliberto Harts MD   On: 03/20/2021 10:41     ____________________________________________  PROCEDURES   Procedure(s) performed (including Critical Care):  Procedures  ____________________________________________  INITIAL IMPRESSION / MDM / ASSESSMENT AND PLAN / ED COURSE  As part of my medical decision making, I reviewed the following data within the electronic MEDICAL RECORD NUMBER Nursing notes reviewed and incorporated, Old chart reviewed, Notes from prior ED visits, and [place] Controlled Substance Database       *ALECXIS BALTZELL was evaluated in Emergency Department on 03/20/2021 for the symptoms described in the history of present illness. He was evaluated in the context of the global COVID-19 pandemic, which necessitated consideration that the patient might be at risk for infection  with the SARS-CoV-2 virus that causes COVID-19. Institutional protocols and algorithms that pertain to the evaluation of patients at risk for COVID-19 are in a state of rapid change based on information released by regulatory bodies including the CDC and federal and state organizations. These policies and algorithms were followed during the patient's care in the ED.  Some ED evaluations and interventions may be delayed as a result of limited staffing during the pandemic.*     Medical Decision Making: 60 year old male here with bilateral lower extremity edema.  Per review of records, patient is chronically hypotensive, and I suspect he has likely hypertension with possible underlying cardiovascular disease causing edema.  Differential also includes simple dependent edema.  Symptoms are bilateral, with no pain, warmth, or signs to suggest DVT, or cellulitis or infection.  Chest x-ray is clear without cardiomegaly or signs of CHF.  CBC unremarkable without anemia.  BMP with normal renal function.  LFTs normal.  BNP negative.  No chest pain or signs to suggest ischemia.  Patient was given a dose of Lasix here, and will place the patient on hydrochlorothiazide as  an outpatient, as this should help with his baseline hypertension as well as his edema.  Will have him follow-up with a PCP in the next 1 week for repeat lab work and further treatment.  ____________________________________________  FINAL CLINICAL IMPRESSION(S) / ED DIAGNOSES  Final diagnoses:  Peripheral edema     MEDICATIONS GIVEN DURING THIS VISIT:  Medications  furosemide (LASIX) tablet 40 mg (40 mg Oral Given 03/20/21 1005)     ED Discharge Orders          Ordered    hydrochlorothiazide (MICROZIDE) 12.5 MG capsule  Daily,   Status:  Discontinued        03/20/21 1110    hydrochlorothiazide (MICROZIDE) 12.5 MG capsule  Daily        03/20/21 1112             Note:  This document was prepared using Dragon voice recognition software and may include unintentional dictation errors.   Shaune Pollack, MD 03/20/21 [phone]

## 2021-05-08 ENCOUNTER — Other Ambulatory Visit: Payer: Self-pay

## 2021-05-08 ENCOUNTER — Encounter: Payer: Self-pay | Admitting: Emergency Medicine

## 2021-05-08 ENCOUNTER — Emergency Department
Admission: EM | Admit: 2021-05-08 | Discharge: 2021-05-08 | Disposition: A | Discharge status: Home / Self Care | Payer: Self-pay | Attending: Emergency Medicine | Admitting: Emergency Medicine

## 2021-05-08 ENCOUNTER — Emergency Department: Payer: Self-pay

## 2021-05-08 DIAGNOSIS — R300 Dysuria: Secondary | ICD-10-CM | POA: Insufficient documentation

## 2021-05-08 DIAGNOSIS — Z20822 Contact with and (suspected) exposure to covid-19: Secondary | ICD-10-CM | POA: Insufficient documentation

## 2021-05-08 DIAGNOSIS — R059 Cough, unspecified: Secondary | ICD-10-CM | POA: Insufficient documentation

## 2021-05-08 DIAGNOSIS — R3981 Functional urinary incontinence: Secondary | ICD-10-CM | POA: Insufficient documentation

## 2021-05-08 DIAGNOSIS — R1030 Lower abdominal pain, unspecified: Secondary | ICD-10-CM | POA: Insufficient documentation

## 2021-05-08 DIAGNOSIS — F17210 Nicotine dependence, cigarettes, uncomplicated: Secondary | ICD-10-CM | POA: Insufficient documentation

## 2021-05-08 LAB — URINALYSIS, COMPLETE (UACMP) WITH MICROSCOPIC
Bilirubin Urine: NEGATIVE
Glucose, UA: NEGATIVE mg/dL
Hgb urine dipstick: NEGATIVE
Ketones, ur: NEGATIVE mg/dL
Nitrite: NEGATIVE
Protein, ur: NEGATIVE mg/dL
Specific Gravity, Urine: 1.016 (ref 1.005–1.030)
Squamous Epithelial / HPF: NONE SEEN (ref 0–5)
pH: 6 (ref 5.0–8.0)

## 2021-05-08 LAB — BASIC METABOLIC PANEL
Anion gap: 7 (ref 5–15)
BUN: 11 mg/dL (ref 6–20)
CO2: 26 mmol/L (ref 22–32)
Calcium: 8.7 mg/dL — ABNORMAL LOW (ref 8.9–10.3)
Chloride: 104 mmol/L (ref 98–111)
Creatinine, Ser: 0.99 mg/dL (ref 0.61–1.24)
GFR, Estimated: >60 mL/min (ref >60)
Glucose, Bld: 109 mg/dL — ABNORMAL HIGH (ref 70–99)
Potassium: 4.3 mmol/L (ref 3.5–5.1)
Sodium: 137 mmol/L (ref 135–145)

## 2021-05-08 LAB — CBC
HCT: 43 % (ref 39.0–52.0)
Hemoglobin: 14.7 g/dL (ref 13.0–17.0)
MCH: 31.1 pg (ref 26.0–34.0)
MCHC: 34.2 g/dL (ref 30.0–36.0)
MCV: 90.9 fL (ref 80.0–100.0)
Platelets: 289 10*3/uL (ref 150–400)
RBC: 4.73 MIL/uL (ref 4.22–5.81)
RDW: 14.4 % (ref 11.5–15.5)
WBC: 7.9 10*3/uL (ref 4.0–10.5)
nRBC: 0 % (ref 0.0–0.2)

## 2021-05-08 LAB — RESP PANEL BY RT-PCR (FLU A&B, COVID) ARPGX2
Influenza A by PCR: NEGATIVE
Influenza B by PCR: NEGATIVE
SARS Coronavirus 2 by RT PCR: NEGATIVE

## 2021-05-08 LAB — CHLAMYDIA/NGC RT PCR (ARMC ONLY)
Chlamydia Tr: NOT DETECTED
N gonorrhoeae: NOT DETECTED

## 2021-05-08 MED ORDER — CEFTRIAXONE SODIUM 1 G IJ SOLR
1.0000 g | Freq: Once | INTRAMUSCULAR | Status: AC
  Administered 2021-05-08: 1 g via INTRAMUSCULAR
  Filled 2021-05-08: qty 10

## 2021-05-08 MED ORDER — ACETAMINOPHEN 500 MG PO TABS: 1000.0000 mg | ORAL_TABLET | Freq: Once | ORAL | Status: DC

## 2021-05-08 MED ORDER — IOHEXOL 350 MG/ML SOLN
100.0000 mL | Freq: Once | INTRAVENOUS | Status: AC | PRN
  Administered 2021-05-08: 100 mL via INTRAVENOUS

## 2021-05-08 MED ORDER — DOXYCYCLINE HYCLATE 100 MG PO CAPS: 100.0000 mg | ORAL_CAPSULE | Freq: Two times a day (BID) | ORAL | 0 refills | Status: AC

## 2021-05-08 NOTE — ED Provider Notes (Signed)
[facility] Emergency Department Provider Note  ____________________________________________   Event Date/Time   First MD Initiated Contact with Patient 05/08/21 1028     (approximate)  I have reviewed the triage vital signs and the nursing notes.   HISTORY  Chief Complaint Urinary Incontinence and Flank Pain   HPI Calvin Wheeler is a 60 y.o. male without significant past medical history who presents for assessment of several months of some increasing lower abdominal crampy discomfort.  He also states that sometimes he will have some urine incontinence and feel he has a sudden urgency to pee will have a little bit of loss urine for making the bathroom.  He notes he was seen on 6/16 and prescribed short course of a diuretic as he had some swelling in his legs at that time that seems to have improved.  He did not get follow-up from that visit.  He has no known history of heart failure.  He denies any back pain, diarrhea, vomiting, fecal incontinence, numbness or weakness in his legs.  He has not had any chest pain, or shortness of breath, headache area, sore throat or any other clear associated sick symptoms.  Endorses mild nonproductive cough on and off for the last couple weeks.  He sometimes has a little burning with nation but no blood and that the burning has been on and off for several months.  No other acute concerns at this time.  Denies any penile rashes lesions or other scrotal symptoms.       History reviewed. No pertinent past medical history.  Patient Active Problem List   Diagnosis Date Noted   Rectal bleeding     Past Surgical History:  Procedure Laterality Date   COLONOSCOPY WITH PROPOFOL N/A 11/03/2018   Procedure: COLONOSCOPY WITH PROPOFOL;  Surgeon: Toney Reil, MD;  Location: [facility] ENDOSCOPY;  Service: Gastroenterology;  Laterality: N/A;   COLONOSCOPY WITH PROPOFOL N/A 11/04/2018   Procedure: COLONOSCOPY WITH PROPOFOL;  Surgeon:  Wyline Mood, MD;  Location: [facility] ENDOSCOPY;  Service: Gastroenterology;  Laterality: N/A;   HERNIA REPAIR      Prior to Admission medications   Medication Sig Start Date End Date Taking? Authorizing Provider  doxycycline (VIBRAMYCIN) 100 MG capsule Take 1 capsule (100 mg total) by mouth 2 (two) times daily for 14 days. 05/08/21 05/22/21 Yes Gilles Chiquito, MD  albuterol (VENTOLIN HFA) 108 (90 Base) MCG/ACT inhaler Inhale 2 puffs into the lungs every 6 (six) hours as needed for wheezing or shortness of breath. 01/01/20   Enid Derry, PA-C  azithromycin (ZITHROMAX Z-PAK) 250 MG tablet Take 2 tablets (500 mg) on  Day 1,  followed by 1 tablet (250 mg) once daily on Days 2 through 5. 01/01/20   Enid Derry, PA-C  hydrochlorothiazide (MICROZIDE) 12.5 MG capsule Take 1 capsule (12.5 mg total) by mouth daily. 03/20/21 04/19/21  Shaune Pollack, MD  predniSONE (STERAPRED UNI-PAK 21 TAB) 10 MG (21) TBPK tablet Take by mouth daily. Dispense steroid taper pack as directed 02/13/20   Emily Filbert, MD    Allergies Sulfa antibiotics  History reviewed. No pertinent family history.  Social History Social History   Tobacco Use   Smoking status: Some Days    Packs/day: 0.50    Types: Cigars, Cigarettes   Smokeless tobacco: Never   Tobacco comments:    Smokes 1 black and mild/day 05/08/21  Vaping Use   Vaping Use: Never used  Substance Use Topics   Alcohol use: No  Drug use: Never    Review of Systems  Review of Systems  Constitutional:  Negative for chills and fever.  HENT:  Negative for sore throat.   Eyes:  Negative for pain.  Respiratory:  Positive for cough. Negative for stridor.   Cardiovascular:  Negative for chest pain.  Gastrointestinal:  Positive for abdominal pain. Negative for vomiting.  Genitourinary:  Positive for dysuria and urgency.  Musculoskeletal:  Negative for myalgias.  Skin:  Negative for rash.  Neurological:  Negative for seizures, loss of consciousness and  headaches.  Psychiatric/Behavioral:  Negative for suicidal ideas.   All other systems reviewed and are negative.    ____________________________________________   PHYSICAL EXAM:  VITAL SIGNS: ED Triage Vitals  Enc Vitals Group     BP 05/08/21 0654 138/87     Pulse Rate 05/08/21 0654 65     Resp 05/08/21 0654 20     Temp 05/08/21 0654 98.2 F (36.8 C)     Temp Source 05/08/21 0654 Oral     SpO2 05/08/21 0654 94 %     Weight 05/08/21 0649 280 lb (127 kg)     Height 05/08/21 0649 6\' 2"  (1.88 m)     Head Circumference --      Peak Flow --      Pain Score 05/08/21 0649 7     Pain Loc --      Pain Edu? --      Excl. in GC? --    Vitals:   05/08/21 0654  BP: 138/87  Pulse: 65  Resp: 20  Temp: 98.2 F (36.8 C)  SpO2: 94%   Physical Exam Vitals and nursing note reviewed.  Constitutional:      Appearance: He is well-developed.  HENT:     Head: Normocephalic and atraumatic.     Right Ear: External ear normal.     Left Ear: External ear normal.     Nose: Nose normal.  Eyes:     Conjunctiva/sclera: Conjunctivae normal.  Cardiovascular:     Rate and Rhythm: Normal rate and regular rhythm.     Heart sounds: No murmur heard. Pulmonary:     Effort: Pulmonary effort is normal. No respiratory distress.     Breath sounds: Normal breath sounds.  Abdominal:     Palpations: Abdomen is soft.     Tenderness: There is no abdominal tenderness. There is no right CVA tenderness or left CVA tenderness.  Musculoskeletal:     Cervical back: Neck supple.     Right lower leg: Edema present.     Left lower leg: Edema present.  Skin:    General: Skin is warm and dry.  Neurological:     Mental Status: He is alert and oriented to person, place, and time.  Psychiatric:        Mood and Affect: Mood normal.     ____________________________________________   LABS (all labs ordered are listed, but only abnormal results are displayed)  Labs Reviewed  URINALYSIS, COMPLETE (UACMP) WITH  MICROSCOPIC - Abnormal; Notable for the following components:      Result Value   Color, Urine YELLOW (*)    APPearance CLEAR (*)    Leukocytes,Ua TRACE (*)    Bacteria, UA RARE (*)    All other components within normal limits  BASIC METABOLIC PANEL - Abnormal; Notable for the following components:   Glucose, Bld 109 (*)    Calcium 8.7 (*)    All other components within normal limits  CHLAMYDIA/NGC  RT PCR ([facility] ONLY)            RESP PANEL BY RT-PCR (FLU A&B, COVID) [id number]  URINE CULTURE  CBC   ____________________________________________  EKG  ____________________________________________  RADIOLOGY  ED MD interpretation: CT abdomen pelvis shows no clear acute abnormal pelvic findings: Evidence of appendicitis, diverticulitis hernia containing any bowel, kidney stone or other clear acute process.  There is small bilateral inguinal hernias with some fat.  Left adrenal adenoma and some moderate hepatic steatosis as well as colonic diverticuli without evidence of diverticulosis.  Official radiology report(s): CT ABDOMEN PELVIS W CONTRAST  Result Date: 05/08/2021 CLINICAL DATA:  Bilateral abdominal and flank pain for 1 month. EXAM: CT ABDOMEN AND PELVIS WITH CONTRAST TECHNIQUE: Multidetector CT imaging of the abdomen and pelvis was performed using the standard protocol following bolus administration of intravenous contrast. CONTRAST:  OMNIPAQUE IOHEXOL 350 MG/ML SOLN COMPARISON:  01/28/2021 and 09/17/2018 FINDINGS: Lower Chest: No acute findings. Hepatobiliary: No hepatic masses identified. Moderate diffuse hepatic steatosis again seen. Gallbladder is unremarkable. No evidence of biliary ductal dilatation. Pancreas:  No mass or inflammatory changes. Spleen: Within normal limits in size and appearance. Adrenals/Urinary Tract: 2 cm low-attenuation left adrenal mass again seen, consistent with benign adenoma. Small bilateral renal cysts are again seen, however there is no evidence of renal  masses. No evidence of ureteral calculi or hydronephrosis. Stomach/Bowel: No evidence of obstruction, inflammatory process or abnormal fluid collections. Normal appendix visualized. Diverticulosis is seen mainly involving the descending and sigmoid colon, however there is no evidence of diverticulitis. Vascular/Lymphatic: No pathologically enlarged lymph nodes. No acute vascular findings. Aortic atherosclerotic calcification noted. Reproductive:  No mass or other significant abnormality. Other: Stable small bilateral inguinal hernias, which contain only fat. Musculoskeletal:  No suspicious bone lesions identified. IMPRESSION: No acute findings within the abdomen or pelvis. Colonic diverticulosis, without radiographic evidence of diverticulitis. Stable moderate hepatic steatosis. Stable benign left adrenal adenoma. Stable small bilateral inguinal hernias, which contain only fat. Aortic Atherosclerosis (ICD10-I70.0). Electronically Signed   By: Danae Orleans M.D.   On: 05/08/2021 12:31    ____________________________________________   PROCEDURES  Procedure(s) performed (including Critical Care):  Procedures   ____________________________________________   INITIAL IMPRESSION / ASSESSMENT AND PLAN / ED COURSE      Patient presents with above to history exam for several months of some intermittent but seemingly progressive worsening in the lower bilateral abdominal crampiness associated with some urinary urgency and intermittent incontinence when he cannot make it to the bathroom in time.  He also states he has had several months of intermittent burning with urination but no blood.  He has no back pain or fevers or other rashes or lesions.  On arrival he is afebrile hemodynamically stable.  Differential includes possible urinary retention related to bladder outlet obstruction, cystitis or urethritis.  CT abdomen pelvis shows no clear acute abnormal pelvic findings: Evidence of appendicitis,  diverticulitis hernia containing any bowel, kidney stone or other clear acute process.  There is small bilateral inguinal hernias with some fat.  Left adrenal adenoma and some moderate hepatic steatosis as well as colonic diverticuli without evidence of diverticulosis.  CBC shows no leukocytosis or acute anemia.  BMP shows no significant electrolyte or metabolic derangements.  UA has trace leukocyte esterase and some bacteria but no nitrites or WBCs.  Urine culture and GC studies sent.  With regard to patient's abdominal discomfort no clear source at this time with relatively unremarkable CT.  No evidence on exam,  CT or work-up of acute cholecystitis, pancreatitis, kidney stone, diverticulitis, significant hernia other than small bilateral hernias containing some fat or any other clear acute process explain patient's pain. On  Reassessment his abdomen is soft nontender throughout and have a low suspicion for immediate life-threatening process.  No evidence of significant retention.  We will treat for possible urethritis given intermittent burning in the last couple weeks and some bacteria seen on UA.  With regard to patient's cough seems has been ongoing for several weeks at least.  There is no wheezing to suggest obstructive airway disease.  He has no shortness of breath, chest pain, tachycardia, tachypnea or hypoxia and I have a low suspicion for PE at this time.  He has no abnormal breath sounds fever or leukocytosis to suggest bacterial pneumonia.  Suspect possible bronchitis.  Will test for COVID and flu and I advised patient to follow-up these results online.  Will write short course of Doxy to cover possible STD related urethritis.  Advised patient to follow-up with PCP.  Discharged stable condition.  Strict return precautions advised and discussed.         ____________________________________________   FINAL CLINICAL IMPRESSION(S) / ED DIAGNOSES  Final diagnoses:  Cough  Dysuria   Lower abdominal pain    Medications  acetaminophen (TYLENOL) tablet 1,000 mg (1,000 mg Oral Patient Refused/Not Given 05/08/21 1245)  cefTRIAXone (ROCEPHIN) injection 1 g (has no administration in time range)  iohexol (OMNIPAQUE) 350 MG/ML injection 100 mL (100 mLs Intravenous Contrast Given 05/08/21 1142)     ED Discharge Orders          Ordered    doxycycline (VIBRAMYCIN) 100 MG capsule  2 times daily        05/08/21 1244             Note:  This document was prepared using Dragon voice recognition software and may include unintentional dictation errors.    Gilles ChiquitoSmith, Oliver Neuwirth P, MD 05/08/21 1249

## 2021-05-08 NOTE — Discharge Instructions (Addendum)
Pt instructed to follow up with PCP and instructed on how to pick up and take antibiotics.

## 2021-05-08 NOTE — ED Triage Notes (Signed)
Pt arrived via POV with reports of bilateral flank pain radiating down both sides of abdomen.  PT states he feels top heavy.  Pt states sxs started a couple of months ago, but have worsened. Pt states the pain started where he was having interruptions in sleep. PT states his sleep is worse now and feels sore and states he feels "top heavy" when he walks.  Pt also reports he has been "pissing" on himself more often as well.

## 2021-05-09 LAB — URINE CULTURE: Culture: NO GROWTH

## 2021-06-07 ENCOUNTER — Encounter: Payer: Self-pay | Admitting: Emergency Medicine

## 2021-06-07 ENCOUNTER — Emergency Department
Admission: EM | Admit: 2021-06-07 | Discharge: 2021-06-07 | Disposition: A | Discharge status: Home / Self Care | Payer: Self-pay | Attending: Student in an Organized Health Care Education/Training Program | Admitting: Student in an Organized Health Care Education/Training Program

## 2021-06-07 ENCOUNTER — Other Ambulatory Visit: Payer: Self-pay

## 2021-06-07 DIAGNOSIS — F17210 Nicotine dependence, cigarettes, uncomplicated: Secondary | ICD-10-CM | POA: Insufficient documentation

## 2021-06-07 DIAGNOSIS — R600 Localized edema: Secondary | ICD-10-CM | POA: Insufficient documentation

## 2021-06-07 LAB — CBC WITH DIFFERENTIAL/PLATELET
Abs Immature Granulocytes: 0.03 10*3/uL (ref 0.00–0.07)
Basophils Absolute: 0 10*3/uL (ref 0.0–0.1)
Basophils Relative: 0 %
Eosinophils Absolute: 0.3 10*3/uL (ref 0.0–0.5)
Eosinophils Relative: 4 %
HCT: 39.4 % (ref 39.0–52.0)
Hemoglobin: 13.1 g/dL (ref 13.0–17.0)
Immature Granulocytes: 0 %
Lymphocytes Relative: 23 %
Lymphs Abs: 1.7 10*3/uL (ref 0.7–4.0)
MCH: 30 pg (ref 26.0–34.0)
MCHC: 33.2 g/dL (ref 30.0–36.0)
MCV: 90.2 fL (ref 80.0–100.0)
Monocytes Absolute: 0.8 10*3/uL (ref 0.1–1.0)
Monocytes Relative: 11 %
Neutro Abs: 4.6 10*3/uL (ref 1.7–7.7)
Neutrophils Relative %: 62 %
Platelets: 325 10*3/uL (ref 150–400)
RBC: 4.37 MIL/uL (ref 4.22–5.81)
RDW: 14.3 % (ref 11.5–15.5)
WBC: 7.4 10*3/uL (ref 4.0–10.5)
nRBC: 0 % (ref 0.0–0.2)

## 2021-06-07 LAB — BRAIN NATRIURETIC PEPTIDE: B Natriuretic Peptide: 12.7 pg/mL (ref 0.0–100.0)

## 2021-06-07 LAB — BASIC METABOLIC PANEL
Anion gap: 5 (ref 5–15)
BUN: 12 mg/dL (ref 6–20)
CO2: 27 mmol/L (ref 22–32)
Calcium: 9.1 mg/dL (ref 8.9–10.3)
Chloride: 106 mmol/L (ref 98–111)
Creatinine, Ser: 1.13 mg/dL (ref 0.61–1.24)
GFR, Estimated: >60 mL/min (ref >60)
Glucose, Bld: 127 mg/dL — ABNORMAL HIGH (ref 70–99)
Potassium: 4 mmol/L (ref 3.5–5.1)
Sodium: 138 mmol/L (ref 135–145)

## 2021-06-07 MED ORDER — HYDROCHLOROTHIAZIDE 12.5 MG PO CAPS: 12.5000 mg | ORAL_CAPSULE | Freq: Every day | ORAL | 3 refills | Status: DC

## 2021-06-07 NOTE — ED Provider Notes (Signed)
[facility] Emergency Department Provider Note    Event Date/Time   First MD Initiated Contact with Patient 06/07/21 1122     (approximate)  I have reviewed the triage vital signs and the nursing notes.   HISTORY  Chief Complaint Leg Swelling    HPI ARYAMAN HALIBURTON is a 60 y.o. male who presents to the ER for evaluation of lower extremity edema has been ongoing for several weeks.  States that symptoms started after he ran out of his HCTZ medication.  Denies any chest pain or shortness of breath.  Is hoping get a refill on his HCTZ.  Does not have PCP.  Denies any recent trauma.  No pain or discomfort with deep inspiration.  No history of DVT.  History reviewed. No pertinent past medical history. No family history on file. Past Surgical History:  Procedure Laterality Date   COLONOSCOPY WITH PROPOFOL N/A 11/03/2018   Procedure: COLONOSCOPY WITH PROPOFOL;  Surgeon: Toney Reil, MD;  Location: [facility] ENDOSCOPY;  Service: Gastroenterology;  Laterality: N/A;   COLONOSCOPY WITH PROPOFOL N/A 11/04/2018   Procedure: COLONOSCOPY WITH PROPOFOL;  Surgeon: Wyline Mood, MD;  Location: [facility] ENDOSCOPY;  Service: Gastroenterology;  Laterality: N/A;   HERNIA REPAIR     Patient Active Problem List   Diagnosis Date Noted   Rectal bleeding       Prior to Admission medications   Medication Sig Start Date End Date Taking? Authorizing Provider  albuterol (VENTOLIN HFA) 108 (90 Base) MCG/ACT inhaler Inhale 2 puffs into the lungs every 6 (six) hours as needed for wheezing or shortness of breath. 01/01/20   Enid Derry, PA-C  azithromycin (ZITHROMAX Z-PAK) 250 MG tablet Take 2 tablets (500 mg) on  Day 1,  followed by 1 tablet (250 mg) once daily on Days 2 through 5. 01/01/20   Enid Derry, PA-C  hydrochlorothiazide (MICROZIDE) 12.5 MG capsule Take 1 capsule (12.5 mg total) by mouth daily. 06/07/21 07/07/21  Willy Eddy, MD  predniSONE (STERAPRED UNI-PAK 21 TAB) 10  MG (21) TBPK tablet Take by mouth daily. Dispense steroid taper pack as directed 02/13/20   Emily Filbert, MD    Allergies Sulfa antibiotics    Social History Social History   Tobacco Use   Smoking status: Some Days    Packs/day: 0.50    Types: Cigars, Cigarettes   Smokeless tobacco: Never   Tobacco comments:    Smokes 1 black and mild/day 05/08/21  Vaping Use   Vaping Use: Never used  Substance Use Topics   Alcohol use: No   Drug use: Never    Review of Systems Patient denies headaches, rhinorrhea, blurry vision, numbness, shortness of breath, chest pain, edema, cough, abdominal pain, nausea, vomiting, diarrhea, dysuria, fevers, rashes or hallucinations unless otherwise stated above in HPI. ____________________________________________   PHYSICAL EXAM:  VITAL SIGNS: Vitals:   06/07/21 0936  BP: 118/73  Pulse: 72  Resp: 20  Temp: 98.4 F (36.9 C)  SpO2: 95%    Constitutional: Alert and oriented.  Eyes: Conjunctivae are normal.  Head: Atraumatic. Nose: No congestion/rhinnorhea. Mouth/Throat: Mucous membranes are moist.   Neck: No stridor. Painless ROM.  Cardiovascular: Normal rate, regular rhythm. Grossly normal heart sounds.  Good peripheral circulation. Respiratory: Normal respiratory effort.  No retractions. Lungs CTAB. Gastrointestinal: Soft and nontender. No distention. No abdominal bruits. No CVA tenderness. Genitourinary:  Musculoskeletal: No lower extremity tenderness. 2+ BLE edema.  No joint effusions. Neurologic:  Normal speech and language. No gross focal  neurologic deficits are appreciated. No facial droop Skin:  Skin is warm, dry and intact. No rash noted. Psychiatric: Mood and affect are normal. Speech and behavior are normal.  ____________________________________________   LABS (all labs ordered are listed, but only abnormal results are displayed)  Results for orders placed or performed during the hospital encounter of 06/07/21 (from the  past 24 hour(s))  Brain natriuretic peptide     Status: None   Collection Time: 06/07/21  9:40 AM  Result Value Ref Range   B Natriuretic Peptide 12.7 0.0 - 100.0 pg/mL  CBC with Differential/Platelet     Status: None   Collection Time: 06/07/21  9:40 AM  Result Value Ref Range   WBC 7.4 4.0 - 10.5 K/uL   RBC 4.37 4.22 - 5.81 MIL/uL   Hemoglobin 13.1 13.0 - 17.0 g/dL   HCT 04.8 88.9 - 16.9 %   MCV 90.2 80.0 - 100.0 fL   MCH 30.0 26.0 - 34.0 pg   MCHC 33.2 30.0 - 36.0 g/dL   RDW 45.0 38.8 - 82.8 %   Platelets 325 150 - 400 K/uL   nRBC 0.0 0.0 - 0.2 %   Neutrophils Relative % 62 %   Neutro Abs 4.6 1.7 - 7.7 K/uL   Lymphocytes Relative 23 %   Lymphs Abs 1.7 0.7 - 4.0 K/uL   Monocytes Relative 11 %   Monocytes Absolute 0.8 0.1 - 1.0 K/uL   Eosinophils Relative 4 %   Eosinophils Absolute 0.3 0.0 - 0.5 K/uL   Basophils Relative 0 %   Basophils Absolute 0.0 0.0 - 0.1 K/uL   Immature Granulocytes 0 %   Abs Immature Granulocytes 0.03 0.00 - 0.07 K/uL  Basic metabolic panel     Status: Abnormal   Collection Time: 06/07/21  9:40 AM  Result Value Ref Range   Sodium 138 135 - 145 mmol/L   Potassium 4.0 3.5 - 5.1 mmol/L   Chloride 106 98 - 111 mmol/L   CO2 27 22 - 32 mmol/L   Glucose, Bld 127 (H) 70 - 99 mg/dL   BUN 12 6 - 20 mg/dL   Creatinine, Ser 0.03 0.61 - 1.24 mg/dL   Calcium 9.1 8.9 - 49.1 mg/dL   GFR, Estimated >79 >15 mL/min   Anion gap 5 5 - 15   ____________________________________________ ____________________________________________   PROCEDURES  Procedure(s) performed:  Procedures    Critical Care performed: no ____________________________________________   INITIAL IMPRESSION / ASSESSMENT AND PLAN / ED COURSE  Pertinent labs & imaging results that were available during my care of the patient were reviewed by me and considered in my medical decision making (see chart for details).   DDX: Peripheral edema, lymphedema, cellulitis, DVT, CHF, medication  noncompliance  ZAKAR BROSCH is a 60 y.o. who presents to the ED with presentation as described above.  Patient clinically well-appearing with recurrent lower extremity edema that was previously well managed and treated with HCTZ.  Recurred after recently running out of his HCTZ.  Not consistent with DVT.  No signs or symptoms of acute CHF.  Blood work otherwise reassuring.  Will refill HCTZ and give referral to PCP.  Discussed signs and symptoms which patient should return to the ER.  Patient agreeable to plan.     The patient was evaluated in Emergency Department today for the symptoms described in the history of present illness. He/she was evaluated in the context of the global COVID-19 pandemic, which necessitated consideration that the patient might be  at risk for infection with the SARS-CoV-2 virus that causes COVID-19. Institutional protocols and algorithms that pertain to the evaluation of patients at risk for COVID-19 are in a state of rapid change based on information released by regulatory bodies including the CDC and federal and state organizations. These policies and algorithms were followed during the patient's care in the ED.  As part of my medical decision making, I reviewed the following data within the electronic MEDICAL RECORD NUMBER Nursing notes reviewed and incorporated, Labs reviewed, notes from prior ED visits and [place] Controlled Substance Database   ____________________________________________   FINAL CLINICAL IMPRESSION(S) / ED DIAGNOSES  Final diagnoses:  Leg edema      NEW MEDICATIONS STARTED DURING THIS VISIT:  Current Discharge Medication List       Note:  This document was prepared using Dragon voice recognition software and may include unintentional dictation errors.    Willy Eddy, MD 06/07/21 1136

## 2021-06-07 NOTE — ED Triage Notes (Signed)
Pt arrived to ED POV with c/o bilat leg swelling. He was seen here before for it and put on HCTZ and that worked well. He has ran out of it and his swelling started back up three days ago. Denies any SHOB. States because of swelling it is hard to walk. Would like to see if he could get a refill of medication

## 2021-08-13 IMAGING — CT CT ABD-PELV W/ CM
2 of 5 series · 17 of 46 positions shown, 19 images · IV contrast (APPLIED)
Comparison: September 17, 2018.

CLINICAL DATA: Acute generalized abdominal pain.

EXAM:
CT ABDOMEN AND PELVIS WITH CONTRAST
TECHNIQUE: Multidetector CT imaging of the abdomen and pelvis was performed
using the standard protocol following bolus administration of
intravenous contrast.
CONTRAST:  100mL OMNIPAQUE IOHEXOL 300 MG/ML  SOLN

[Series 2: routine abd/pel with · axial · 0.87mm/px · z∈[-487,-57]mm · 14 of 98 slices shown, 16 images]
[im 6/98  soft-tissue]
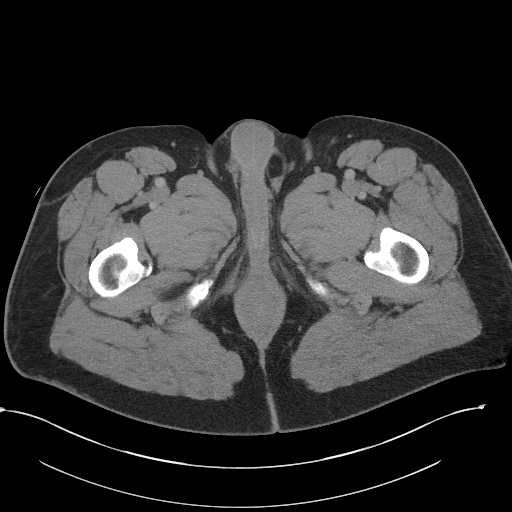
[im 6/98  bone]
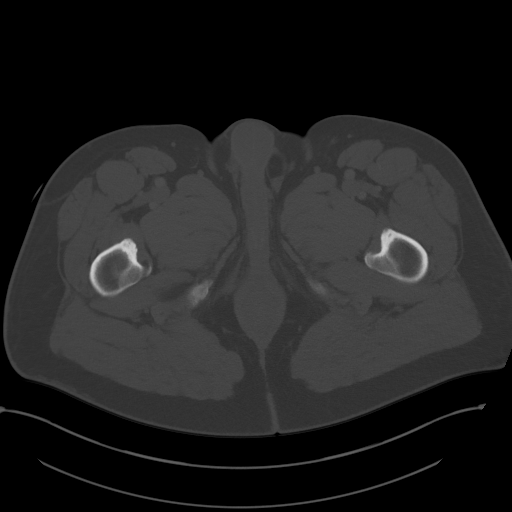
[im 11/98  soft-tissue]
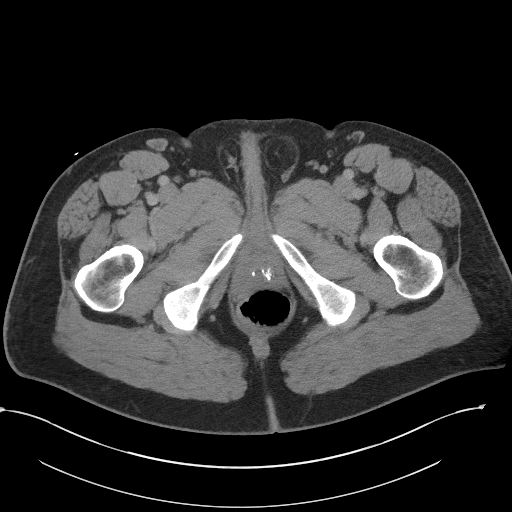
[im 22/98  soft-tissue]
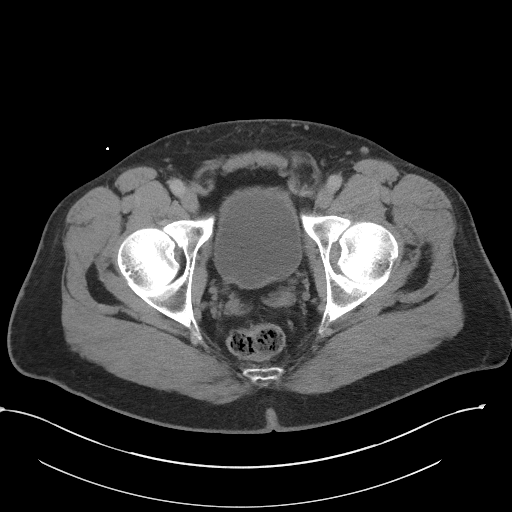
[im 27/98  soft-tissue]
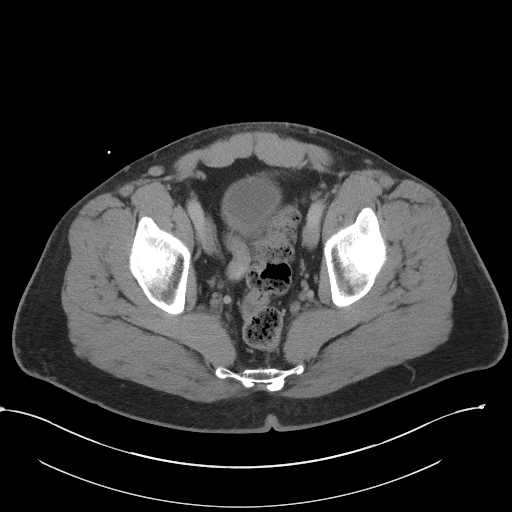
[im 33/98  soft-tissue]
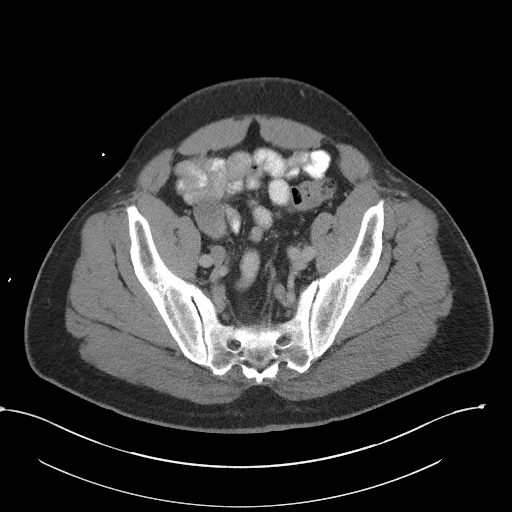
[im 38/98  soft-tissue]
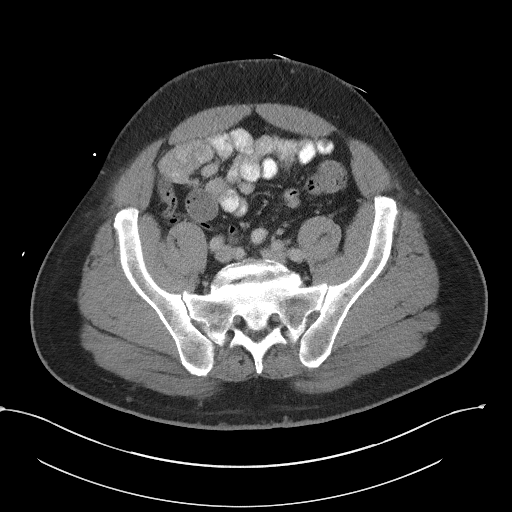
[im 44/98  soft-tissue]
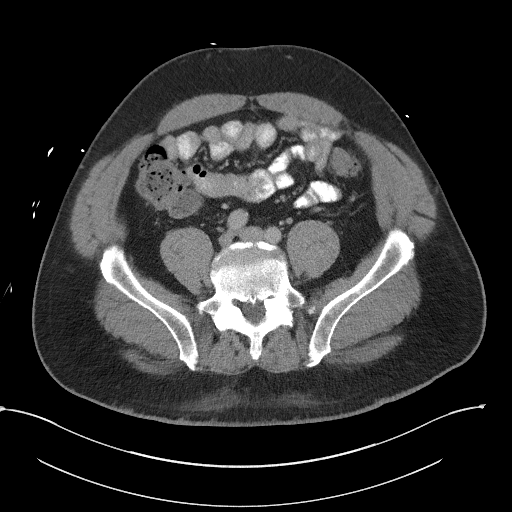
[im 54/98  soft-tissue]
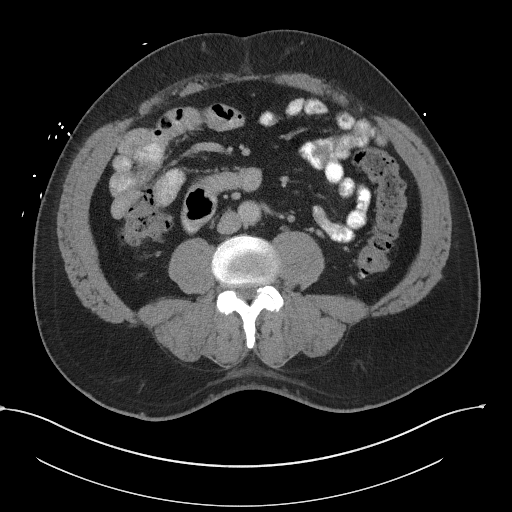
[im 60/98  soft-tissue]
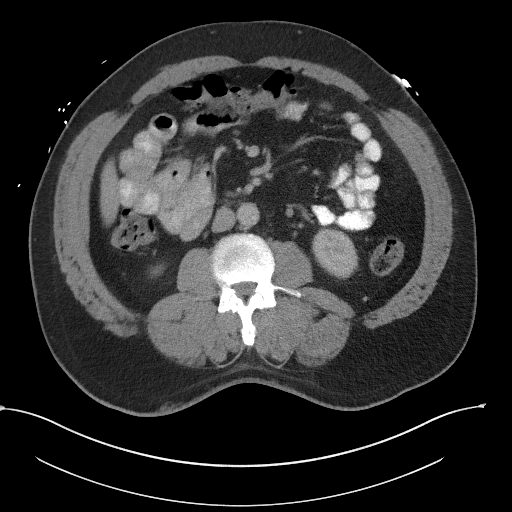
[im 60/98  bone]
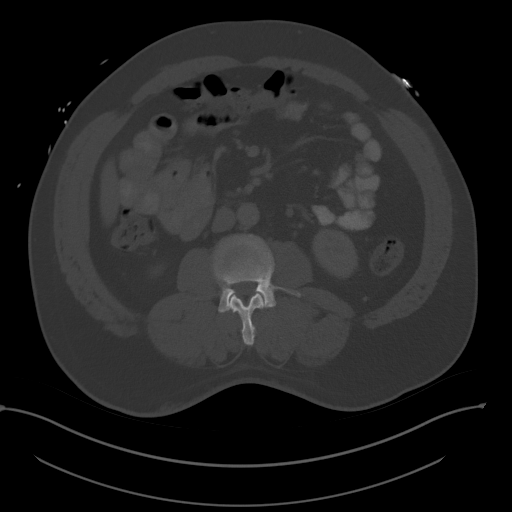
[im 65/98  soft-tissue]
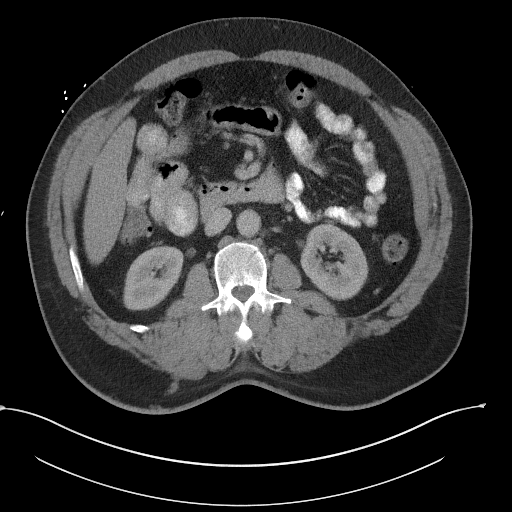
[im 71/98  soft-tissue]
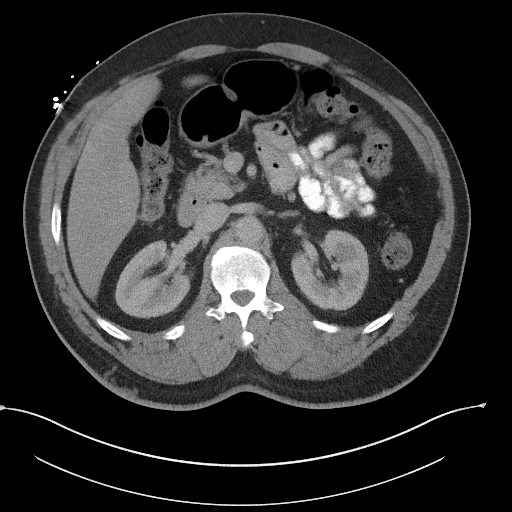
[im 76/98  soft-tissue]
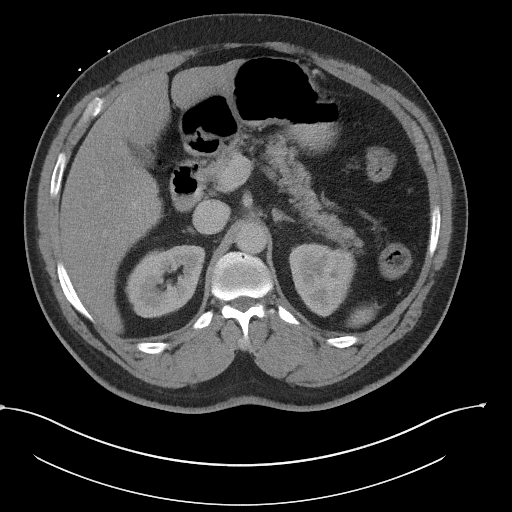
[im 87/98  soft-tissue]
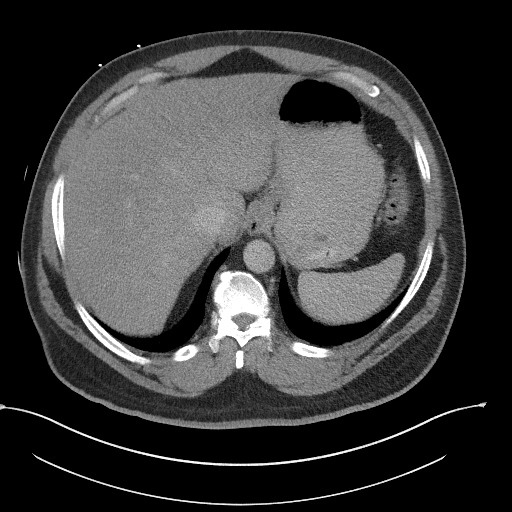
[im 92/98  soft-tissue]
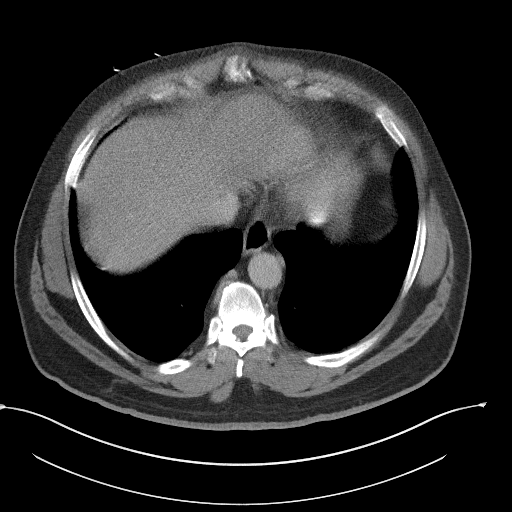

[Series 5: coronal st · coronal · 0.89mm/px · 3 of 124 slices shown]
[im 42/124  soft-tissue]
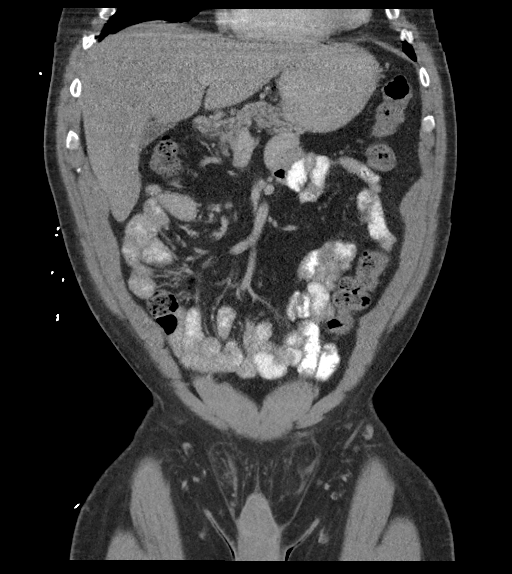
[im 55/124  soft-tissue]
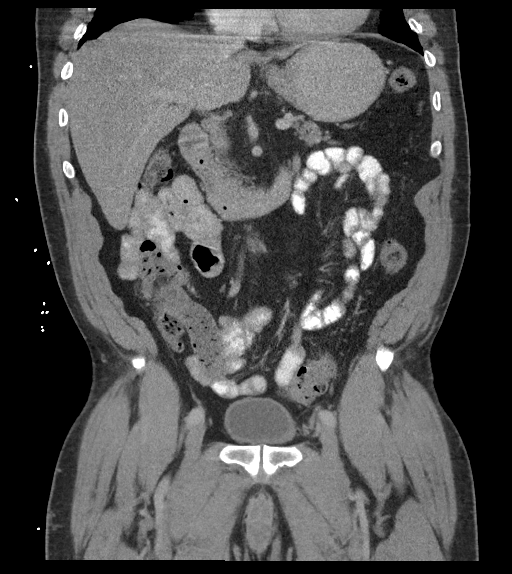
[im 69/124  soft-tissue]
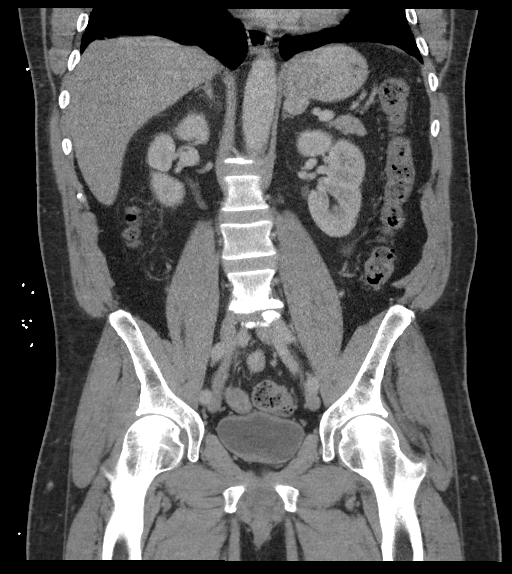

[17 of 46 positions shown; findings below may reference images not displayed]

FINDINGS: Lower chest: No acute abnormality.

Hepatobiliary: No gallstones or biliary dilatation is noted. Several
small hepatic cysts are noted.

Pancreas: Unremarkable. No pancreatic ductal dilatation or
surrounding inflammatory changes.

Spleen: Normal in size without focal abnormality.

Adrenals/Urinary Tract: Adrenal glands are unremarkable. Except for
small bilateral renal cysts, kidneys are normal, without renal
calculi, focal lesion, or hydronephrosis. Bladder is unremarkable.

Stomach/Bowel: Stomach is within normal limits. Appendix appears
normal. No evidence of bowel wall thickening, distention, or
inflammatory changes.

Vascular/Lymphatic: Aortic atherosclerosis. No enlarged abdominal or
pelvic lymph nodes.

Reproductive: Mild prostatic enlargement is noted.

Other: Small bilateral fat containing inguinal hernias are noted. No
ascites is noted.

Musculoskeletal: No acute or significant osseous findings.
IMPRESSION: Small bilateral fat containing inguinal hernias are noted.

No other significant abnormality seen in the abdomen or pelvis.

Aortic Atherosclerosis ([id number]-[id number].I).

## 2021-10-03 IMAGING — CR DG CHEST 2V
2 series · 2 of 2 positions shown · non-contrast
Comparison: February 13, 2020.

CLINICAL DATA: Cough.

EXAM:
CHEST - 2 VIEW

[chest pa]
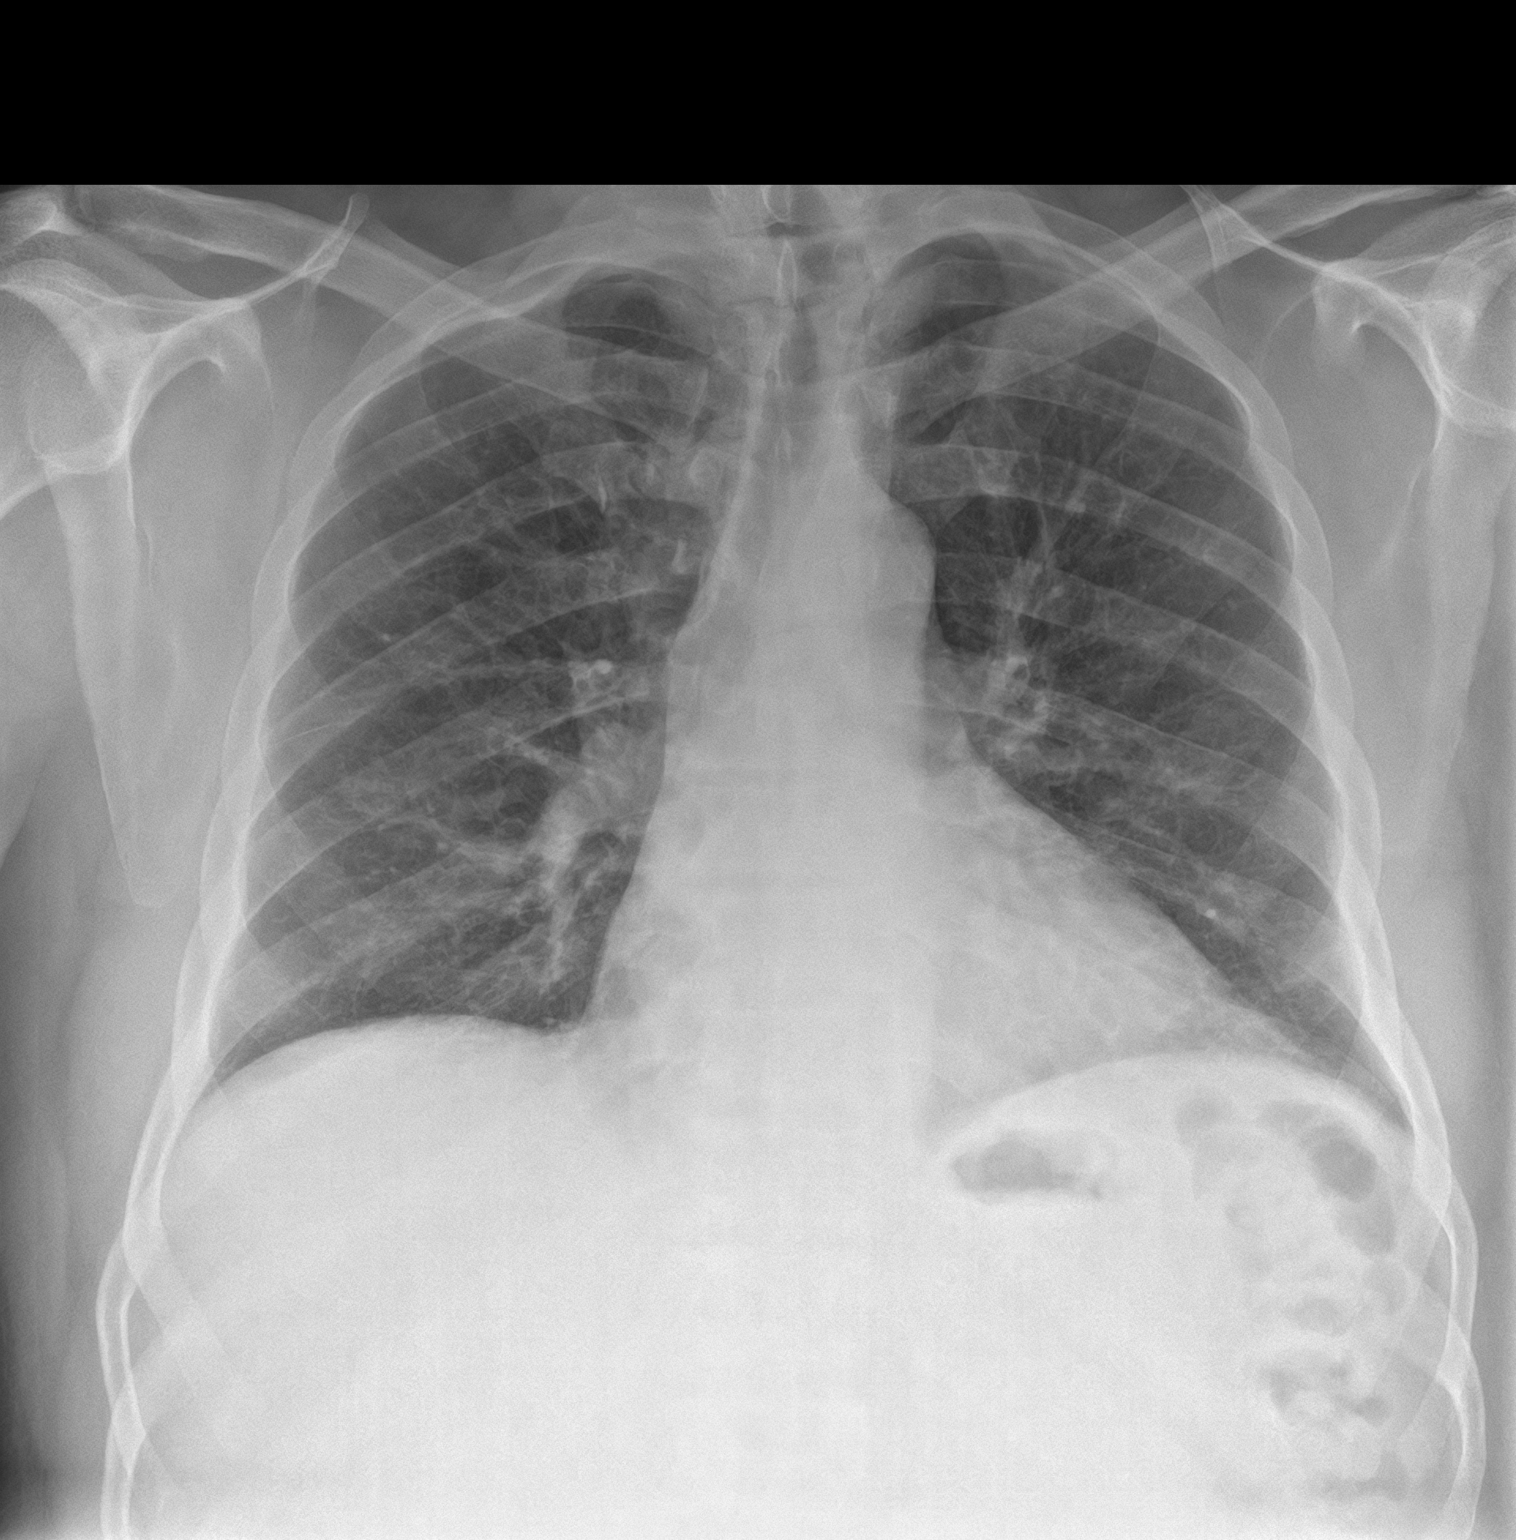

[chest lat]
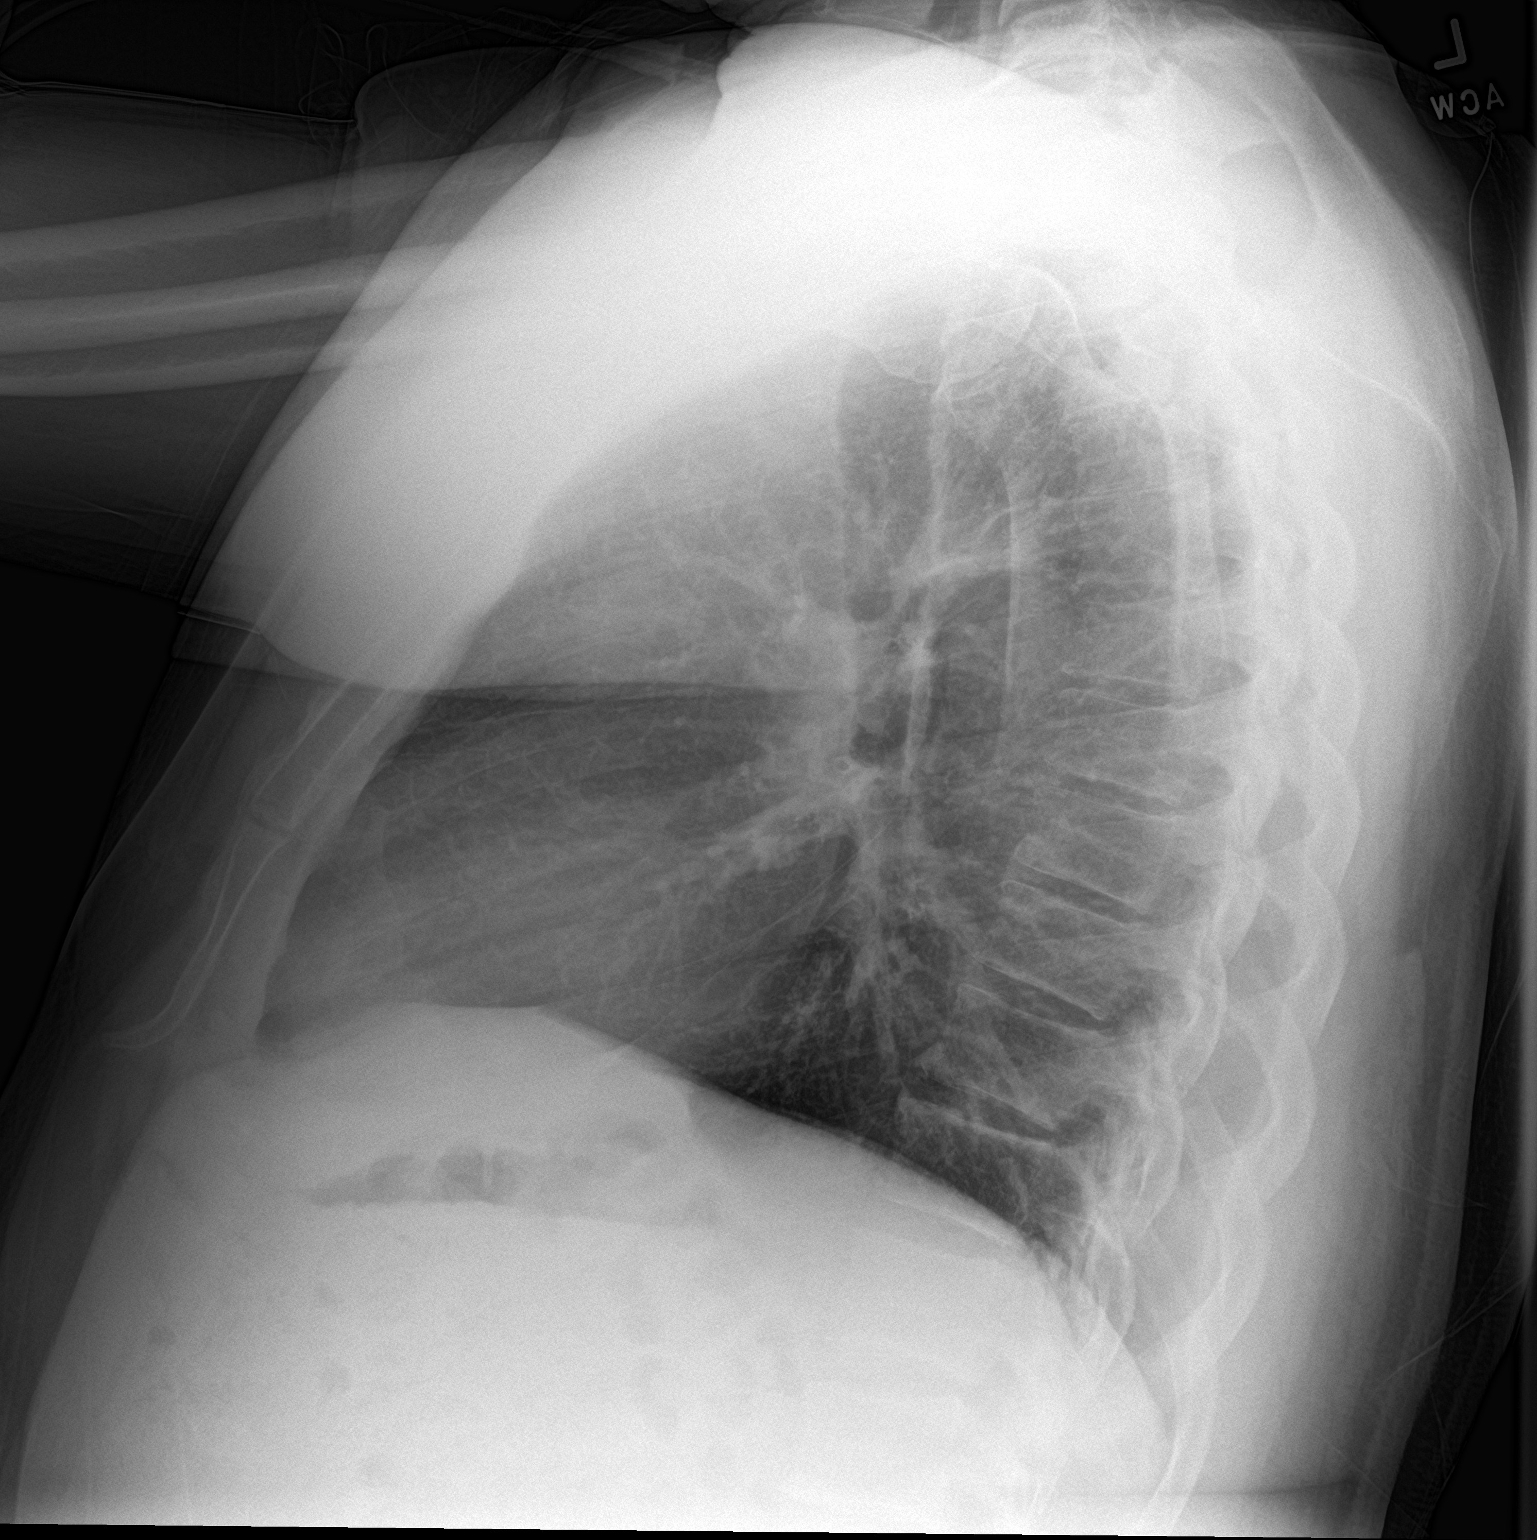

[2 of 2 positions shown; findings below may reference images not displayed]

FINDINGS: The heart size and mediastinal contours are within normal limits.
Both lungs are clear. No visible pleural effusions or pneumothorax.
Biapical pleuroparenchymal thickening. No acute osseous abnormality.
IMPRESSION: No active cardiopulmonary disease.

## 2022-08-13 ENCOUNTER — Emergency Department
Admission: EM | Admit: 2022-08-13 | Discharge: 2022-08-13 | Disposition: A | Discharge status: Home / Self Care | Payer: Self-pay | Attending: Emergency Medicine | Admitting: Emergency Medicine

## 2022-08-13 ENCOUNTER — Other Ambulatory Visit: Payer: Self-pay

## 2022-08-13 ENCOUNTER — Emergency Department: Payer: Self-pay

## 2022-08-13 DIAGNOSIS — I10 Essential (primary) hypertension: Secondary | ICD-10-CM | POA: Insufficient documentation

## 2022-08-13 DIAGNOSIS — Z20822 Contact with and (suspected) exposure to covid-19: Secondary | ICD-10-CM | POA: Insufficient documentation

## 2022-08-13 LAB — CBC WITH DIFFERENTIAL/PLATELET
Abs Immature Granulocytes: 0.05 10*3/uL (ref 0.00–0.07)
Basophils Absolute: 0 10*3/uL (ref 0.0–0.1)
Basophils Relative: 0 %
Eosinophils Absolute: 0.3 10*3/uL (ref 0.0–0.5)
Eosinophils Relative: 3 %
HCT: 44.9 % (ref 39.0–52.0)
Hemoglobin: 14.6 g/dL (ref 13.0–17.0)
Immature Granulocytes: 1 %
Lymphocytes Relative: 33 %
Lymphs Abs: 2.6 10*3/uL (ref 0.7–4.0)
MCH: 28.9 pg (ref 26.0–34.0)
MCHC: 32.5 g/dL (ref 30.0–36.0)
MCV: 88.9 fL (ref 80.0–100.0)
Monocytes Absolute: 0.8 10*3/uL (ref 0.1–1.0)
Monocytes Relative: 10 %
Neutro Abs: 4.2 10*3/uL (ref 1.7–7.7)
Neutrophils Relative %: 53 %
Platelets: 311 10*3/uL (ref 150–400)
RBC: 5.05 MIL/uL (ref 4.22–5.81)
RDW: 15 % (ref 11.5–15.5)
WBC: 8 10*3/uL (ref 4.0–10.5)
nRBC: 0 % (ref 0.0–0.2)

## 2022-08-13 LAB — COMPREHENSIVE METABOLIC PANEL
ALT: 48 U/L — ABNORMAL HIGH (ref 0–44)
AST: 33 U/L (ref 15–41)
Albumin: 4.3 g/dL (ref 3.5–5.0)
Alkaline Phosphatase: 77 U/L (ref 38–126)
Anion gap: 8 (ref 5–15)
BUN: 13 mg/dL (ref 8–23)
CO2: 27 mmol/L (ref 22–32)
Calcium: 9.5 mg/dL (ref 8.9–10.3)
Chloride: 103 mmol/L (ref 98–111)
Creatinine, Ser: 1.03 mg/dL (ref 0.61–1.24)
GFR, Estimated: >60 mL/min (ref >60)
Glucose, Bld: 111 mg/dL — ABNORMAL HIGH (ref 70–99)
Potassium: 4.4 mmol/L (ref 3.5–5.1)
Sodium: 138 mmol/L (ref 135–145)
Total Bilirubin: 0.7 mg/dL (ref 0.3–1.2)
Total Protein: 8.6 g/dL — ABNORMAL HIGH (ref 6.5–8.1)

## 2022-08-13 LAB — RESP PANEL BY RT-PCR (FLU A&B, COVID) ARPGX2
Influenza A by PCR: NEGATIVE
Influenza B by PCR: NEGATIVE
SARS Coronavirus 2 by RT PCR: NEGATIVE

## 2022-08-13 LAB — TSH: TSH: 1.484 u[IU]/mL (ref 0.350–4.500)

## 2022-08-13 LAB — LIPASE, BLOOD: Lipase: 29 U/L (ref 11–51)

## 2022-08-13 MED ORDER — AMLODIPINE BESYLATE 5 MG PO TABS: 5.0000 mg | ORAL_TABLET | Freq: Every day | ORAL | 0 refills | Status: DC

## 2022-08-13 MED ORDER — ACETAMINOPHEN 500 MG PO TABS
1000.0000 mg | ORAL_TABLET | Freq: Once | ORAL | Status: AC
  Administered 2022-08-13: 1000 mg via ORAL
  Filled 2022-08-13: qty 2

## 2022-08-13 MED ORDER — AMLODIPINE BESYLATE 5 MG PO TABS
5.0000 mg | ORAL_TABLET | Freq: Once | ORAL | Status: AC
  Administered 2022-08-13: 5 mg via ORAL
  Filled 2022-08-13: qty 1

## 2022-08-13 MED ORDER — NAPROXEN 500 MG PO TABS
500.0000 mg | ORAL_TABLET | Freq: Once | ORAL | Status: AC
  Administered 2022-08-13: 500 mg via ORAL
  Filled 2022-08-13: qty 1

## 2022-08-13 MED ORDER — AMLODIPINE BESYLATE 5 MG PO TABS
5.0000 mg | ORAL_TABLET | Freq: Every day | ORAL | 0 refills | Status: DC
  Filled 2022-08-13: qty 30, 30d supply, fill #0

## 2022-08-13 MED ORDER — METOCLOPRAMIDE HCL 10 MG PO TABS
10.0000 mg | ORAL_TABLET | Freq: Once | ORAL | Status: AC
  Administered 2022-08-13: 10 mg via ORAL
  Filled 2022-08-13: qty 1

## 2022-08-13 NOTE — ED Provider Notes (Signed)
[facility] Provider Note    Event Date/Time   First MD Initiated Contact with Patient 08/13/22 [phone]     (approximate)   History   Chief Complaint: Hypertension   HPI  Calvin Wheeler is a 61 y.o. male with no known past medical history who comes the ED complaining of generalized headaches, fatigue, body aches, decreased appetite for the last 3 days.  He also gets nausea with eating, but denies vomiting diarrhea or significant pain.  He is also noticed that his blood pressure is elevated and is worried that this is the cause of his symptoms.    Denies any chest pain or shortness of breath, no exertional symptoms.  No vision changes paresthesias or motor weakness.  No weight changes recently.  Denies any trauma. Patient notes that he has very limited financial resources, does not have a car, does not have health insurance or a doctor and is worried about being able to obtain needed medications if prescribed.      Physical Exam   Triage Vital Signs: ED Triage Vitals  Enc Vitals Group     BP 08/13/22 0858 (!) 160/117     Pulse Rate 08/13/22 0858 81     Resp 08/13/22 0858 20     Temp 08/13/22 0858 98.4 F (36.9 C)     Temp src --      SpO2 08/13/22 0858 98 %     Weight 08/13/22 0859 275 lb (124.7 kg)     Height 08/13/22 0859 6\' 2"  (1.88 m)     Head Circumference --      Peak Flow --      Pain Score 08/13/22 0857 9     Pain Loc --      Pain Edu? --      Excl. in GC? --     Most recent vital signs: Vitals:   08/13/22 0858 08/13/22 0935  BP: (!) 160/117 (!) 153/96  Pulse: 81   Resp: 20   Temp: 98.4 F (36.9 C)   SpO2: 98%     General: Awake, no distress.  CV:  Good peripheral perfusion.  Regular rate and rhythm Resp:  Normal effort.  Clear to auscultation bilaterally Abd:  No distention.  Soft with mild right upper quadrant tenderness Other:  No lower extremity edema.  Thyroid nonpalpable.   ED Results / Procedures / Treatments    Labs (all labs ordered are listed, but only abnormal results are displayed) Labs Reviewed  COMPREHENSIVE METABOLIC PANEL - Abnormal; Notable for the following components:      Result Value   Glucose, Bld 111 (*)    Total Protein 8.6 (*)    ALT 48 (*)    All other components within normal limits  RESP PANEL BY RT-PCR (FLU A&B, COVID) ARPGX2  CBC WITH DIFFERENTIAL/PLATELET  LIPASE, BLOOD  TSH     EKG    RADIOLOGY Ultrasound interpreted by me, negative for choledocholithiasis or cholecystitis.  Radiology report reviewed.   PROCEDURES:  Procedures   MEDICATIONS ORDERED IN ED: Medications  acetaminophen (TYLENOL) tablet 1,000 mg (1,000 mg Oral Given 08/13/22 1002)  metoCLOPramide (REGLAN) tablet 10 mg (10 mg Oral Given 08/13/22 1235)  naproxen (NAPROSYN) tablet 500 mg (500 mg Oral Given 08/13/22 1235)  amLODipine (NORVASC) tablet 5 mg (5 mg Oral Given 08/13/22 1236)     IMPRESSION / MDM / ASSESSMENT AND PLAN / ED COURSE  I reviewed the triage vital signs and the nursing notes.  Differential diagnosis includes, but is not limited to, essential hypertension, electrolyte abnormality, AKI, cholecystitis, choledocholithiasis, pancreatitis, hypothyroidism, viral illness  Patient's presentation is most consistent with acute presentation with potential threat to life or bodily function.  Patient presents with uncontrolled hypertension evidenced by his diastolic blood pressure of 117.  Would benefit from starting antihypertensive.  Constitutional symptoms and upper abdominal pain with some right upper quadrant tenderness.  TSH lipase and other serum labs are normal.  Will obtain ultrasound right upper quadrant.  Will obtain COVID swab.  Give Tylenol for headache.  Regarding financial constraints, will provide community resource information for the patient including FQHCs.  Also as of December 1 of this year [place] eligibility will be  expanded which may benefit the patient.   ----------------------------------------- 12:36 PM on 08/13/2022 ----------------------------------------- Work-up completely reassuring.  Will get additional naproxen and Reglan for his headache.  Will start on Norvasc.  Outpatient pharmacy can provide him a 30-day supply for now.  Information about Medicaid eligibility and local primary care clinics provided.      FINAL CLINICAL IMPRESSION(S) / ED DIAGNOSES   Final diagnoses:  Uncontrolled hypertension     Rx / DC Orders   ED Discharge Orders          Ordered    amLODipine (NORVASC) 5 MG tablet  Daily        08/13/22 1234             Note:  This document was prepared using Dragon voice recognition software and may include unintentional dictation errors.   Sharman Cheek, MD 08/13/22 [phone]

## 2022-08-13 NOTE — ED Notes (Signed)
Discussed new prescriptions how and where to pick. Given information about goodrx.   VSS at discharge, verbalizes understanding of follow up and return precautions. Leaves with steady gait.

## 2022-08-13 NOTE — ED Triage Notes (Signed)
Pt states coming in due to high blood pressure, headaches and loss of appetite. Pt reports BP at home was 149/100. Pt denies light sensitivity  Pt states this past Monday he noticed his blood pressure and headache were getting worse. Pt states he has not taken anything for his headache since yesterday morning.

## 2022-08-13 NOTE — Discharge Instructions (Signed)
As of December 1st, Medicaid eligibility in [place] will include all adults with annual income under 138% of the federal poverty line (about $23,000 for a family of 2).  You can inquire at the Dept of Social Services or online to apply for coverage.

## 2022-08-13 NOTE — ED Notes (Signed)
Pt reports continued HA - notified provider

## 2022-08-18 NOTE — Congregational Nurse Program (Signed)
  Dept: [phone]   Congregational Nurse Program Note  Date of Encounter: 08/18/2022 Client to [facility] center for assistance with [facility] application. Application completed and RN to drop off today. Client has a phone and will check for an apt tomorrow, if not contacted by [facility] first. Client was seen at the [facility] ED on 11/10 for high blood pressure, he was given a prescription for amlodipine 5 mg, which was filled at the [facility] community pharmacy. He reports he is taking the medication. He also reports that his high blood pressure "scared him enough to stop smoking" Client appreciative of assistance provided. No other needs at this time. BP (!) 148/88 (BP Location: Left Arm, Patient Position: Sitting, Cuff Size: Large)   Pulse 70   SpO2 98%   Past Medical History: No past medical history on file..  Encounter Details:  CNP Questionnaire - 08/18/22 0847       Questionnaire   Ask client: Do you give verbal consent for me to treat you today? Yes    Student Assistance N/A    Location Patient Served  [facility]    Visit Setting with Client Organization    Patient Status Unhoused   staying "here and there"   Insurance Uninsured ([employer] Card/Care Connects/Self-Pay/Medicaid Family Planning)    Insurance/Financial Assistance Referral N/A    Medication N/A   has medications from [facility] community pharmacy   Medical Provider No   [facility] appliactaion completed   Screening Referrals Made N/A    Medical Referrals Made Non-[facility] PCP/Clinic   [facility] application completed   Medical Appointment Made N/A    Recently w/o PCP, now 1st time PCP visit completed due to CNs referral or appointment made N/A    Food N/A    Transportation N/A   using the [employer] bus   Housing/Utilities No permanent housing    Economist N/A    Interventions Advocate/Support;Navigate Healthcare System;Educate;Reviewed Medications    Screenings CN Performed Blood Pressure;Pulse Ox     Sent Client to Lab for: N/A    Did client attend any of the following based off CNs referral or appointments made? N/A    ED Visit Averted N/A    Life-Saving Intervention Made N/A

## 2022-09-02 ENCOUNTER — Other Ambulatory Visit: Payer: Self-pay | Admitting: Gerontology

## 2022-09-02 ENCOUNTER — Other Ambulatory Visit: Payer: Self-pay

## 2022-09-02 ENCOUNTER — Inpatient Hospital Stay: Payer: Self-pay

## 2022-09-02 ENCOUNTER — Ambulatory Visit: Admission: RE | Admit: 2022-09-02 | Payer: Self-pay | Source: Ambulatory Visit

## 2022-09-02 ENCOUNTER — Ambulatory Visit: Payer: Self-pay | Admitting: Gerontology

## 2022-09-02 ENCOUNTER — Encounter: Payer: Self-pay | Admitting: Gerontology

## 2022-09-02 VITALS — BP 139/83 | HR 85 | Temp 98.3°F | Resp 16 | Ht 72.5 in | Wt 290.1 lb

## 2022-09-02 DIAGNOSIS — R600 Localized edema: Secondary | ICD-10-CM

## 2022-09-02 DIAGNOSIS — Z7689 Persons encountering health services in other specified circumstances: Secondary | ICD-10-CM | POA: Insufficient documentation

## 2022-09-02 DIAGNOSIS — I10 Essential (primary) hypertension: Secondary | ICD-10-CM

## 2022-09-02 MED ORDER — AMLODIPINE BESYLATE 5 MG PO TABS
5.0000 mg | ORAL_TABLET | Freq: Every day | ORAL | 0 refills | Status: DC
  Filled 2022-09-02 – 2022-09-08 (×2): qty 30, 30d supply, fill #0

## 2022-09-02 NOTE — Patient Instructions (Signed)

## 2022-09-02 NOTE — Progress Notes (Signed)
New Patient Office Visit  Subjective    Patient ID: Calvin Wheeler, male    DOB: [birth year]  Age: 61 y.o. MRN: [id number]  CC:  Chief Complaint  Patient presents with   Establish Care   Hypertension    HPI Calvin Wheeler is a 61 y/o male who has history of hypertension and presents to establish care.  He was seen at the ED on 08/13/22 for uncontrolled hypertension and was started on 5 mg Amlodipine daily. He states that he's compliant with his medication, and continues to make healthy lifestyle changes. He resides at [facility]. He c/o swelling to bilateral lower legs, left greater than right that has been going on for 3-4 months prior to starting Amlodipine. He  reports experiencing claudication and thightness to left shin when he stands or walks for some hours. He states that swelling resolves with elevating his legs. He denies chest pain, shortness of breath, orthopnea, and erythema. Overall, he states that he's doing well and offers no further complaint.   Outpatient Encounter Medications as of 09/02/2022  Medication Sig   albuterol (VENTOLIN HFA) 108 (90 Base) MCG/ACT inhaler Inhale 2 puffs into the lungs every 6 (six) hours as needed for wheezing or shortness of breath.   [DISCONTINUED] amLODipine (NORVASC) 5 MG tablet Take 1 tablet (5 mg total) by mouth daily.   amLODipine (NORVASC) 5 MG tablet Take 1 tablet (5 mg total) by mouth daily.   hydrochlorothiazide (MICROZIDE) 12.5 MG capsule Take 1 capsule (12.5 mg total) by mouth daily.   [DISCONTINUED] amLODipine (NORVASC) 5 MG tablet Take 1 tablet (5 mg total) by mouth daily.   [DISCONTINUED] azithromycin (ZITHROMAX Z-PAK) 250 MG tablet Take 2 tablets (500 mg) on  Day 1,  followed by 1 tablet (250 mg) once daily on Days 2 through 5.   [DISCONTINUED] predniSONE (STERAPRED UNI-PAK 21 TAB) 10 MG (21) TBPK tablet Take by mouth daily. Dispense steroid taper pack as directed   No facility-administered encounter medications on file as  of 09/02/2022.    Past Medical History:  Diagnosis Date   Hypertension     Past Surgical History:  Procedure Laterality Date   COLONOSCOPY WITH PROPOFOL N/A 11/03/2018   Procedure: COLONOSCOPY WITH PROPOFOL;  Surgeon: Toney Reil, MD;  Location: [facility] ENDOSCOPY;  Service: Gastroenterology;  Laterality: N/A;   COLONOSCOPY WITH PROPOFOL N/A 11/04/2018   Procedure: COLONOSCOPY WITH PROPOFOL;  Surgeon: Wyline Mood, MD;  Location: [facility] ENDOSCOPY;  Service: Gastroenterology;  Laterality: N/A;   HERNIA REPAIR     MANDIBLE FRACTURE SURGERY     around the age of 46    Family History  Problem Relation Age of Onset   Hypertension Mother    Stroke Mother    Hypertension Father    Seizures Sister    Other Sister        unknown medical history   Hypertension Sister    Cancer Brother    Prostate cancer Brother    Other Brother        MVA   Other Maternal Grandmother        unknown medical history   Other Maternal Grandfather        unknown medical history   Other Paternal Grandmother        unknown medical history   Other Paternal Grandfather        unknown medical history    Social History   Socioeconomic History   Marital status: Single    Spouse  name: Not on file   Number of children: Not on file   Years of education: Not on file   Highest education level: Not on file  Occupational History   Not on file  Tobacco Use   Smoking status: Former    Packs/day: 0.50    Types: Cigars, Cigarettes    Quit date: 07/2022    Years since quitting: 0.1   Smokeless tobacco: Never   Tobacco comments:    Smokes 1 black and mild/day 05/08/21    09/02/22 Patient is homeless, goes to a shelter or couch surfing  Vaping Use   Vaping Use: Never used  Substance and Sexual Activity   Alcohol use: No   Drug use: Not Currently    Types: "Crack" cocaine    Comment: last use ~2013   Sexual activity: Not on file  Other Topics Concern   Not on file  Social History Narrative   Not on  file   Social Determinants of Health   Financial Resource Strain: Not on file  Food Insecurity: No Food Insecurity (09/02/2022)   Hunger Vital Sign    Worried About Running Out of Food in the Last Year: Never true    Ran Out of Food in the Last Year: Never true  Transportation Needs: No Transportation Needs (09/02/2022)   PRAPARE - Administrator, Civil Service (Medical): No    Lack of Transportation (Non-Medical): No  Physical Activity: Not on file  Stress: Not on file  Social Connections: Not on file  Intimate Partner Violence: Not At Risk (09/02/2022)   Humiliation, Afraid, Rape, and Kick questionnaire    Fear of Current or Ex-Partner: No    Emotionally Abused: No    Physically Abused: No    Sexually Abused: No    Review of Systems  Constitutional: Negative.   HENT: Negative.    Eyes: Negative.   Respiratory: Negative.    Cardiovascular:  Positive for claudication and leg swelling.  Gastrointestinal: Negative.   Genitourinary: Negative.   Musculoskeletal: Negative.   Skin: Negative.   Neurological: Negative.   Endo/Heme/Allergies: Negative.   Psychiatric/Behavioral: Negative.          Objective    BP 139/83 (BP Location: Left Arm, Patient Position: Sitting, Cuff Size: Large)   Pulse 85   Temp 98.3 F (36.8 C) (Oral)   Resp 16   Ht 6' 0.5" (1.842 m)   Wt 290 lb 1.6 oz (131.6 kg)   SpO2 96%   BMI 38.80 kg/m   Physical Exam HENT:     Head: Normocephalic and atraumatic.     Mouth/Throat:     Mouth: Mucous membranes are moist.  Eyes:     Extraocular Movements: Extraocular movements intact.     Conjunctiva/sclera: Conjunctivae normal.     Pupils: Pupils are equal, round, and reactive to light.  Cardiovascular:     Rate and Rhythm: Normal rate and regular rhythm.     Pulses: Normal pulses.     Heart sounds: Normal heart sounds.  Pulmonary:     Effort: Pulmonary effort is normal.     Breath sounds: Normal breath sounds.  Abdominal:      General: Bowel sounds are normal.     Palpations: Abdomen is soft.  Genitourinary:    Comments: Deferred per patient Musculoskeletal:        General: Swelling (+1 edema to left lower leg,) present. No tenderness.     Cervical back: Normal range of motion.  Skin:  General: Skin is warm.  Neurological:     General: No focal deficit present.     Mental Status: He is alert.  Psychiatric:        Mood and Affect: Mood normal.        Behavior: Behavior normal.        Thought Content: Thought content normal.        Judgment: Judgment normal.         Assessment & Plan:   1. Encounter to establish care -Routine labs will be checked - HgB A1c; Future - Lipid panel; Future - Flu Vaccine QUAD 6+ mos PF IM (Fluarix Quad PF) was administered. - Lipid panel - HgB A1c  2. Edema leg - Unknown etiology of edema, unlikely due to side effect Amlodipine since left is > than right. Will r/o DVT with Ultrasound. He was advised to go to the ED for worsening symptoms. He was advised to elevate legs while sitting down. - [facility] LOWER EXTREMITY ARTERIAL DUPLEX BILAT (BACK OFFICE); Future  3. Essential hypertension - His blood pressure is improving, he will continue current medication, DASH diet and exercise as tolerated.  - amLODipine (NORVASC) 5 MG tablet; Take 1 tablet (5 mg total) by mouth daily.  Dispense: 30 tablet; Refill: 0   Return in about 13 days (around 09/15/2022), or if symptoms worsen or fail to improve.   Mckynleigh Mussell Trellis Paganini, NP

## 2022-09-03 LAB — HEMOGLOBIN A1C
Est. average glucose Bld gHb Est-mCnc: 137 mg/dL
Hgb A1c MFr Bld: 6.4 % — ABNORMAL HIGH (ref 4.8–5.6)

## 2022-09-03 LAB — LIPID PANEL
Chol/HDL Ratio: 7.3 ratio — ABNORMAL HIGH (ref 0.0–5.0)
Cholesterol, Total: 277 mg/dL — ABNORMAL HIGH (ref 100–199)
HDL: 38 mg/dL — ABNORMAL LOW (ref >39)
LDL Chol Calc (NIH): 204 mg/dL — ABNORMAL HIGH (ref 0–99)
Triglycerides: 183 mg/dL — ABNORMAL HIGH (ref 0–149)
VLDL Cholesterol Cal: 35 mg/dL (ref 5–40)

## 2022-09-03 NOTE — Congregational Nurse Program (Signed)
  Dept: [phone]   Congregational Nurse Program Note  Date of Encounter: 09/03/2022 Client to [facility] day center as follow up from his first [facility] apt yesterday 11/29. He was not prescribed any new medication, did receive a flu shot. He was also ordered a lower extremity ultrasound due to left lower extremity edema.. His apt was made for him yesterday, but he did not realize it. RN assisted with making a new apt for him, apt at [facility] on 12/1 on 2:30, apt card given to client. Refill for amlodipine to be ready on 12/6. Client aware. No other needs at this time. Past Medical History: Past Medical History:  Diagnosis Date   Hypertension     Encounter Details:  CNP Questionnaire - 09/03/22 0845       Questionnaire   Ask client: Do you give verbal consent for me to treat you today? Yes    Student Assistance N/A    Location Patient Served  [facility]    Visit Setting with Client Organization    Patient Status Unhoused   staying "here and there"   Insurance Uninsured ([employer] Card/Care Connects/Self-Pay/Medicaid Family Planning)    Insurance/Financial Assistance Referral N/A    Medication N/A   has medications from [facility] community pharmacy   Medical Provider Yes   Client is now a client at [facility]   Screening Referrals Made N/A    Medical Referrals Made N/A   [facility] application completed   Medical Appointment Made N/A   apt was made for [facility] for 11/29   Recently w/o PCP, now 1st time PCP visit completed due to CNs referral or appointment made Yes   client did attend his first new patient apt at [facility]   Food N/A    Transportation N/A   using the [employer] bus   Housing/Utilities No permanent housing    Interpersonal Safety N/A    Interventions Advocate/Support;Navigate Healthcare System;Educate;Reviewed Medications    Abnormal to Normal Screening Since Last CN Visit N/A    Screenings CN Performed Blood Pressure;Pulse Ox    Sent Client to Lab for:  N/A    Did client attend any of the following based off CNs referral or appointments made? Medical   [facility]   ED Visit Averted N/A    Life-Saving Intervention Made N/A

## 2022-09-04 ENCOUNTER — Ambulatory Visit
Admission: RE | Admit: 2022-09-04 | Discharge: 2022-09-04 | Disposition: A | Discharge status: Home / Self Care | Payer: Self-pay | Source: Ambulatory Visit | Attending: Gerontology | Admitting: Gerontology

## 2022-09-04 DIAGNOSIS — R600 Localized edema: Secondary | ICD-10-CM | POA: Insufficient documentation

## 2022-09-08 ENCOUNTER — Other Ambulatory Visit: Payer: Self-pay

## 2022-09-09 ENCOUNTER — Other Ambulatory Visit: Payer: Self-pay

## 2022-09-09 ENCOUNTER — Other Ambulatory Visit: Payer: Self-pay | Admitting: Gerontology

## 2022-09-09 DIAGNOSIS — E785 Hyperlipidemia, unspecified: Secondary | ICD-10-CM

## 2022-09-09 DIAGNOSIS — R7303 Prediabetes: Secondary | ICD-10-CM

## 2022-09-09 MED ORDER — METFORMIN HCL 1000 MG PO TABS
500.0000 mg | ORAL_TABLET | Freq: Two times a day (BID) | ORAL | 0 refills | Status: DC
  Filled 2022-09-09: qty 60, 60d supply, fill #0

## 2022-09-09 MED ORDER — ATORVASTATIN CALCIUM 10 MG PO TABS
10.0000 mg | ORAL_TABLET | Freq: Every day | ORAL | 0 refills | Status: DC
  Filled 2022-09-09: qty 30, 30d supply, fill #0

## 2022-09-10 ENCOUNTER — Other Ambulatory Visit: Payer: Self-pay

## 2022-09-10 NOTE — Congregational Nurse Program (Signed)
  Dept: [phone]   Congregational Nurse Program Note  Date of Encounter: 09/10/2022 Client to [facility] day center to pick up medications RN picked up at the [facility] pharmacy, Amlodipine refill given. Client reports that he received a call from the [facility] clinic and that based on his lab work he has 2 new prescription. Notes reviewed, client started on Metformin for prediabetes and on a statin for elevated cholesterol. Client plans to take the [place] bus today to pick up these medications. Client very appreciative of assistance provided. Rn encouraged client to return to [facility] for continued follow up, he voiced understanding and agreement. He does also have a follow up visit scheduled at the [facility] for next week. Past Medical History: Past Medical History:  Diagnosis Date   Hypertension     Encounter Details:  CNP Questionnaire - 09/10/22 0930       Questionnaire   Ask client: Do you give verbal consent for me to treat you today? Yes    Student Assistance N/A    Location Patient Served  [facility]    Visit Setting with Client Organization    Patient Status Unhoused   staying "here and there"   Insurance Uninsured ([employer] Card/Care Connects/Self-Pay/Medicaid Family Planning)    Insurance/Financial Assistance Referral N/A    Medication N/A   has medications from [facility] community pharmacy   Medical Provider Yes   Client is now a client at [facility]   Screening Referrals Made N/A    Medical Referrals Made N/A   [facility] application completed   Medical Appointment Made N/A   client needed his lower extremitiy ultrasound rescheduled as he missed it yesterday. apt made for 12/1 at 2:30   Recently w/o PCP, now 1st time PCP visit completed due to CNs referral or appointment made N/A   client did attend his first new patient apt at [facility]   Food N/A   has food stamps 281.00/month   Transportation N/A   using the [employer] bus    Housing/Utilities No permanent housing    Economist N/A    Interventions Secondary school teacher;Reviewed Medications    Abnormal to Normal Screening Since Last CN Visit N/A    Screenings CN Performed Blood Pressure    Sent Client to Lab for: N/A    Did client attend any of the following based off CNs referral or appointments made? Medication Assistance   [facility]   ED Visit Averted N/A    Life-Saving Intervention Made N/A

## 2022-09-14 NOTE — Congregational Nurse Program (Signed)
  Dept: [phone]   Congregational Nurse Program Note  Date of Encounter: 09/14/2022 Client to [facility] day center with questions about his new medications. He reports having some loose bowels and thought it was from his new cholesterol medication. He was recently started on Metformin, which has this side effect. Rn provided education regarding side effects, encouraged hydration and low acid diet. Client appreciative. And voiced understanding. Past Medical History: Past Medical History:  Diagnosis Date   Hypertension     Encounter Details:  CNP Questionnaire - 09/14/22 0931       Questionnaire   Ask client: Do you give verbal consent for me to treat you today? Yes    Student Assistance N/A    Location Patient Served  [facility]    Visit Setting with Client Organization    Patient Status Unhoused   staying "here and there"   Insurance Uninsured ([place] Card/Care Connects/Self-Pay/Medicaid Family Planning)    Insurance/Financial Assistance Referral N/A    Medication N/A   has medications from [facility] community pharmacy   Medical Provider Yes   Client is now a client at [facility]   Screening Referrals Made N/A    Medical Referrals Made N/A   [facility] application completed   Medical Appointment Made N/A   client needed his lower extremitiy ultrasound rescheduled as he missed it yesterday. apt made for 12/1 at 2:30   Recently w/o PCP, now 1st time PCP visit completed due to CNs referral or appointment made N/A   client did attend his first new patient apt at [facility] clinic   Food N/A   has food stamps 281.00/month   Transportation N/A   using the [employer] bus   Housing/Utilities No permanent housing    Economist N/A    Interventions Advocate/Support;Educate;Reviewed Medications    Abnormal to Normal Screening Since Last CN Visit N/A    Screenings CN Performed N/A    Sent Client to Lab for: N/A    Did client attend any of the following based off CNs referral  or appointments made? N/A   [facility]   ED Visit Averted N/A    Life-Saving Intervention Made N/A

## 2022-09-16 ENCOUNTER — Ambulatory Visit: Payer: Self-pay | Admitting: Gerontology

## 2022-09-16 ENCOUNTER — Encounter: Payer: Self-pay | Admitting: Gerontology

## 2022-09-16 ENCOUNTER — Other Ambulatory Visit: Payer: Self-pay

## 2022-09-16 VITALS — BP 141/81 | HR 93 | Wt 290.7 lb

## 2022-09-16 DIAGNOSIS — R7303 Prediabetes: Secondary | ICD-10-CM

## 2022-09-16 DIAGNOSIS — E785 Hyperlipidemia, unspecified: Secondary | ICD-10-CM

## 2022-09-16 DIAGNOSIS — I10 Essential (primary) hypertension: Secondary | ICD-10-CM

## 2022-09-16 DIAGNOSIS — Z87898 Personal history of other specified conditions: Secondary | ICD-10-CM

## 2022-09-16 MED ORDER — METFORMIN HCL 1000 MG PO TABS
500.0000 mg | ORAL_TABLET | Freq: Two times a day (BID) | ORAL | 0 refills | Status: DC
Start: 2022-09-16 — End: 2022-11-03
  Filled 2022-09-16: qty 120, 120d supply, fill #0

## 2022-09-16 MED ORDER — AMLODIPINE BESYLATE 5 MG PO TABS
5.0000 mg | ORAL_TABLET | Freq: Every day | ORAL | 0 refills | Status: DC
Start: 2022-09-16 — End: 2022-11-03
  Filled 2022-09-16: qty 90, 90d supply, fill #0
  Filled 2022-10-06: qty 30, 30d supply, fill #0

## 2022-09-16 MED ORDER — ALBUTEROL SULFATE HFA 108 (90 BASE) MCG/ACT IN AERS
2.0000 | INHALATION_SPRAY | Freq: Four times a day (QID) | RESPIRATORY_TRACT | 0 refills | Status: AC | PRN
  Filled 2022-09-16: qty 6.7, 25d supply, fill #0

## 2022-09-16 MED ORDER — ATORVASTATIN CALCIUM 10 MG PO TABS
10.0000 mg | ORAL_TABLET | Freq: Every day | ORAL | 2 refills | Status: DC
  Filled 2022-09-16 – 2022-10-08 (×2): qty 90, 90d supply, fill #0

## 2022-09-16 NOTE — Progress Notes (Signed)
I agreed with the note  Established Patient Office Visit  Subjective   Patient ID: Calvin Wheeler, male    DOB: June 30, 1961  Age: 61 y.o. MRN: [id number]  No chief complaint on file.   HPI  Calvin Wheeler is a 61 y/o male who has history of hypertension and presents to the clinic for a one month follow up. He had  bilateral lower extremity venous doppler on 09/04/2022 that revealed no evidence of deep venous thrombosis in lower extremities. He denies claudication and edema in lower extremities. He states that he has been compliant with his medications. He complains of intermittent diarrhea when taking Metformin 500 mg bid with food.  He states that he has been having intermittent mild wheezing for two weeks while laying down and he used his albuterol inhaler as prescribed and it resolved immediately. He denies shortness of breath, and cough .He states that he monitors his blood pressure 1-2x's a week, and his readings  are in the 140's/80's. He reports  that he is now incorparting healthy lifesyle modifications. Overall,  he states that he is doing well an offers no other complaints.   Review of Systems  Constitutional: Negative.   HENT: Negative.    Eyes: Negative.   Respiratory: Negative.    Cardiovascular: Negative.   Gastrointestinal:  Positive for diarrhea.       Per pt intermittent diarrhea due to Metformin 500 mg   Genitourinary: Negative.   Musculoskeletal: Negative.   Skin: Negative.   Neurological: Negative.   Psychiatric/Behavioral: Negative.        Objective:     There were no vitals taken for this visit. BP Readings from Last 3 Encounters:  09/16/22 (!) 141/81  09/10/22 (!) 148/98  09/03/22 130/82   Wt Readings from Last 3 Encounters:  09/16/22 290 lb 11.2 oz (131.9 kg)  09/02/22 290 lb 1.6 oz (131.6 kg)  08/13/22 275 lb (124.7 kg)      Physical Exam HENT:     Head: Normocephalic and atraumatic.     Mouth/Throat:     Mouth: Mucous membranes are moist.   Eyes:     Extraocular Movements: Extraocular movements intact.     Conjunctiva/sclera: Conjunctivae normal.     Pupils: Pupils are equal, round, and reactive to light.  Cardiovascular:     Rate and Rhythm: Normal rate and regular rhythm.     Pulses: Normal pulses.     Heart sounds: Normal heart sounds.  Pulmonary:     Effort: Pulmonary effort is normal.     Breath sounds: Normal breath sounds.  Skin:    General: Skin is warm.  Neurological:     General: No focal deficit present.     Mental Status: He is alert and oriented to person, place, and time. Mental status is at baseline.  Psychiatric:        Mood and Affect: Mood normal.        Behavior: Behavior normal.        Thought Content: Thought content normal.        Judgment: Judgment normal.      No results found for any visits on 09/16/22.  Last CBC Lab Results  Component Value Date   WBC 8.0 08/13/2022   HGB 14.6 08/13/2022   HCT 44.9 08/13/2022   MCV 88.9 08/13/2022   MCH 28.9 08/13/2022   RDW 15.0 08/13/2022   PLT 311 08/13/2022   Last metabolic panel Lab Results  Component Value Date  GLUCOSE 111 (H) 08/13/2022   NA 138 08/13/2022   K 4.4 08/13/2022   CL 103 08/13/2022   CO2 27 08/13/2022   BUN 13 08/13/2022   CREATININE 1.03 08/13/2022   GFRNONAA >60 08/13/2022   CALCIUM 9.5 08/13/2022   PROT 8.6 (H) 08/13/2022   ALBUMIN 4.3 08/13/2022   BILITOT 0.7 08/13/2022   ALKPHOS 77 08/13/2022   AST 33 08/13/2022   ALT 48 (H) 08/13/2022   ANIONGAP 8 08/13/2022   Last lipids Lab Results  Component Value Date   CHOL 277 (H) 09/02/2022   HDL 38 (L) 09/02/2022   LDLCALC 204 (H) 09/02/2022   TRIG 183 (H) 09/02/2022   CHOLHDL 7.3 (H) 09/02/2022   Last hemoglobin A1c Lab Results  Component Value Date   HGBA1C 6.4 (H) 09/02/2022      The 10-year ASCVD risk score (Arnett DK, et al., 2019) is: 33.7%    Assessment & Plan:  1. Essential hypertension - His blood pressure is continuing to improve, he  will continue current medication, DASH diet and exercised encouraged as tolerated.  - amLODipine (NORVASC) 5 MG tablet; Take 1 tablet (5 mg total) by mouth daily.  Dispense: 90 tablet; Refill: 0 - Hepatic function panel; Future  2. Elevated lipids - He will continue on medication as prescribed, encouraged to follow heart healthy diet recommendations.  - atorvastatin (LIPITOR) 10 MG tablet; Take 1 tablet (10 mg total) by mouth daily.  Dispense: 90 tablet; Refill: 2 - Lipid panel; Future  3. Prediabetes - He will continue taking medication as prescribed, encouraged to continue to make healthy lifestyle changes.  - metFORMIN (GLUCOPHAGE) 1000 MG tablet; Take 0.5 tablets (500 mg total) by mouth 2 (two) times daily with a meal.  Dispense: 120 tablet; Refill: 0 - HgB A1c; Future  4. H/O wheezing - He will continue to use albuterol PRN.  - albuterol (VENTOLIN HFA) 108 (90 Base) MCG/ACT inhaler; Inhale 2 puffs into the lungs every 6 (six) hours as needed for wheezing or shortness of breath.  Dispense: 6.7 g; Refill: 0    Labs on 11/11/2022 F/U for clinic appointment on or around (11/18/2022), or if symptoms worsen or fail to improve.    Sharon Mt

## 2022-09-16 NOTE — Patient Instructions (Signed)
Heart-Healthy Eating Plan Many factors influence your heart health, including eating and exercise habits. Heart health is also called coronary health. Coronary risk increases with abnormal blood fat (lipid) levels. A heart-healthy eating plan includes limiting unhealthy fats, increasing healthy fats, limiting salt (sodium) intake, and making other diet and lifestyle changes. What is my plan? Your health care provider may recommend that: You limit your fat intake to _________% or less of your total calories each day. You limit your saturated fat intake to _________% or less of your total calories each day. You limit the amount of cholesterol in your diet to less than _________ mg per day. You limit the amount of sodium in your diet to less than _________ mg per day. What are tips for following this plan? Cooking Cook foods using methods other than frying. Baking, boiling, grilling, and broiling are all good options. Other ways to reduce fat include: Removing the skin from poultry. Removing all visible fats from meats. Steaming vegetables in water or broth. Meal planning  At meals, imagine dividing your plate into fourths: Fill one-half of your plate with vegetables and green salads. Fill one-fourth of your plate with whole grains. Fill one-fourth of your plate with lean protein foods. Eat 2-4 cups of vegetables per day. One cup of vegetables equals 1 cup (91 g) broccoli or cauliflower florets, 2 medium carrots, 1 large bell pepper, 1 large sweet potato, 1 large tomato, 1 medium white potato, 2 cups (150 g) raw leafy greens. Eat 1-2 cups of fruit per day. One cup of fruit equals 1 small apple, 1 large banana, 1 cup (237 g) mixed fruit, 1 large orange,  cup (82 g) dried fruit, 1 cup (240 mL) 100% fruit juice. Eat more foods that contain soluble fiber. Examples include apples, broccoli, carrots, beans, peas, and barley. Aim to get 25-30 g of fiber per day. Increase your consumption of legumes,  nuts, and seeds to 4-5 servings per week. One serving of dried beans or legumes equals  cup (90 g) cooked, 1 serving of nuts is  oz (12 almonds, 24 pistachios, or 7 walnut halves), and 1 serving of seeds equals  oz (8 g). Fats Choose healthy fats more often. Choose monounsaturated and polyunsaturated fats, such as olive and canola oils, avocado oil, flaxseeds, walnuts, almonds, and seeds. Eat more omega-3 fats. Choose salmon, mackerel, sardines, tuna, flaxseed oil, and ground flaxseeds. Aim to eat fish at least 2 times each week. Check food labels carefully to identify foods with trans fats or high amounts of saturated fat. Limit saturated fats. These are found in animal products, such as meats, butter, and cream. Plant sources of saturated fats include palm oil, palm kernel oil, and coconut oil. Avoid foods with partially hydrogenated oils in them. These contain trans fats. Examples are stick margarine, some tub margarines, cookies, crackers, and other baked goods. Avoid fried foods. General information Eat more home-cooked food and less restaurant, buffet, and fast food. Limit or avoid alcohol. Limit foods that are high in added sugar and simple starches such as foods made using white refined flour (white breads, pastries, sweets). Lose weight if you are overweight. Losing just 5-10% of your body weight can help your overall health and prevent diseases such as diabetes and heart disease. Monitor your sodium intake, especially if you have high blood pressure. Talk with your health care provider about your sodium intake. Try to incorporate more vegetarian meals weekly. What foods should I eat? Fruits All fresh, canned (in natural   juice), or frozen fruits. Vegetables Fresh or frozen vegetables (raw, steamed, roasted, or grilled). Green salads. Grains Most grains. Choose whole wheat and whole grains most of the time. Rice and pasta, including brown rice and pastas made with whole wheat. Meats  and other proteins Lean, well-trimmed beef, veal, pork, and lamb. Chicken and turkey without skin. All fish and shellfish. Wild duck, rabbit, pheasant, and venison. Egg whites or low-cholesterol egg substitutes. Dried beans, peas, lentils, and tofu. Seeds and most nuts. Dairy Low-fat or nonfat cheeses, including ricotta and mozzarella. Skim or 1% milk (liquid, powdered, or evaporated). Buttermilk made with low-fat milk. Nonfat or low-fat yogurt. Fats and oils Non-hydrogenated (trans-free) margarines. Vegetable oils, including soybean, sesame, sunflower, olive, avocado, peanut, safflower, corn, canola, and cottonseed. Salad dressings or mayonnaise made with a vegetable oil. Beverages Water (mineral or sparkling). Coffee and tea. Unsweetened ice tea. Diet beverages. Sweets and desserts Sherbet, gelatin, and fruit ice. Small amounts of dark chocolate. Limit all sweets and desserts. Seasonings and condiments All seasonings and condiments. The items listed above may not be a complete list of foods and beverages you can eat. Contact a dietitian for more options. What foods should I avoid? Fruits Canned fruit in heavy syrup. Fruit in cream or butter sauce. Fried fruit. Limit coconut. Vegetables Vegetables cooked in cheese, cream, or butter sauce. Fried vegetables. Grains Breads made with saturated or trans fats, oils, or whole milk. Croissants. Sweet rolls. Donuts. High-fat crackers, such as cheese crackers and chips. Meats and other proteins Fatty meats, such as hot dogs, ribs, sausage, bacon, rib-eye roast or steak. High-fat deli meats, such as salami and bologna. Caviar. Domestic duck and goose. Organ meats, such as liver. Dairy Cream, sour cream, cream cheese, and creamed cottage cheese. Whole-milk cheeses. Whole or 2% milk (liquid, evaporated, or condensed). Whole buttermilk. Cream sauce or high-fat cheese sauce. Whole-milk yogurt. Fats and oils Meat fat, or shortening. Cocoa butter,  hydrogenated oils, palm oil, coconut oil, palm kernel oil. Solid fats and shortenings, including bacon fat, salt pork, lard, and butter. Nondairy cream substitutes. Salad dressings with cheese or sour cream. Beverages Regular sodas and any drinks with added sugar. Sweets and desserts Frosting. Pudding. Cookies. Cakes. Pies. Milk chocolate or white chocolate. Buttered syrups. Full-fat ice cream or ice cream drinks. The items listed above may not be a complete list of foods and beverages to avoid. Contact a dietitian for more information. Summary Heart-healthy meal planning includes limiting unhealthy fats, increasing healthy fats, limiting salt (sodium) intake and making other diet and lifestyle changes. Lose weight if you are overweight. Losing just 5-10% of your body weight can help your overall health and prevent diseases such as diabetes and heart disease. Focus on eating a balance of foods, including fruits and vegetables, low-fat or nonfat dairy, lean protein, nuts and legumes, whole grains, and heart-healthy oils and fats. This information is not intended to replace advice given to you by your health care provider. Make sure you discuss any questions you have with your health care provider. Document Revised: 10/27/2021 Document Reviewed: 10/27/2021 Elsevier Patient Education  2023 Elsevier Inc. DASH Eating Plan DASH stands for Dietary Approaches to Stop Hypertension. The DASH eating plan is a healthy eating plan that has been shown to: Reduce high blood pressure (hypertension). Reduce your risk for type 2 diabetes, heart disease, and stroke. Help with weight loss. What are tips for following this plan? Reading food labels Check food labels for the amount of salt (sodium) per serving. Choose   foods with less than 5 percent of the Daily Value of sodium. Generally, foods with less than 300 milligrams (mg) of sodium per serving fit into this eating plan. To find whole grains, look for the word  "whole" as the first word in the ingredient list. Shopping Buy products labeled as "low-sodium" or "no salt added." Buy fresh foods. Avoid canned foods and pre-made or frozen meals. Cooking Avoid adding salt when cooking. Use salt-free seasonings or herbs instead of table salt or sea salt. Check with your health care provider or pharmacist before using salt substitutes. Do not fry foods. Cook foods using healthy methods such as baking, boiling, grilling, roasting, and broiling instead. Cook with heart-healthy oils, such as olive, canola, avocado, soybean, or sunflower oil. Meal planning  Eat a balanced diet that includes: 4 or more servings of fruits and 4 or more servings of vegetables each day. Try to fill one-half of your plate with fruits and vegetables. 6-8 servings of whole grains each day. Less than 6 oz (170 g) of lean meat, poultry, or fish each day. A 3-oz (85-g) serving of meat is about the same size as a deck of cards. One egg equals 1 oz (28 g). 2-3 servings of low-fat dairy each day. One serving is 1 cup (237 mL). 1 serving of nuts, seeds, or beans 5 times each week. 2-3 servings of heart-healthy fats. Healthy fats called omega-3 fatty acids are found in foods such as walnuts, flaxseeds, fortified milks, and eggs. These fats are also found in cold-water fish, such as sardines, salmon, and mackerel. Limit how much you eat of: Canned or prepackaged foods. Food that is high in trans fat, such as some fried foods. Food that is high in saturated fat, such as fatty meat. Desserts and other sweets, sugary drinks, and other foods with added sugar. Full-fat dairy products. Do not salt foods before eating. Do not eat more than 4 egg yolks a week. Try to eat at least 2 vegetarian meals a week. Eat more home-cooked food and less restaurant, buffet, and fast food. Lifestyle When eating at a restaurant, ask that your food be prepared with less salt or no salt, if possible. If you drink  alcohol: Limit how much you use to: 0-1 drink a day for women who are not pregnant. 0-2 drinks a day for men. Be aware of how much alcohol is in your drink. In the [place] one drink equals one 12 oz bottle of beer (355 mL), one 5 oz glass of wine (148 mL), or one 1 oz glass of hard liquor (44 mL). General information Avoid eating more than 2,300 mg of salt a day. If you have hypertension, you may need to reduce your sodium intake to 1,500 mg a day. Work with your health care provider to maintain a healthy body weight or to lose weight. Ask what an ideal weight is for you. Get at least 30 minutes of exercise that causes your heart to beat faster (aerobic exercise) most days of the week. Activities may include walking, swimming, or biking. Work with your health care provider or dietitian to adjust your eating plan to your individual calorie needs. What foods should I eat? Fruits All fresh, dried, or frozen fruit. Canned fruit in natural juice (without added sugar). Vegetables Fresh or frozen vegetables (raw, steamed, roasted, or grilled). Low-sodium or reduced-sodium tomato and vegetable juice. Low-sodium or reduced-sodium tomato sauce and tomato paste. Low-sodium or reduced-sodium canned vegetables. Grains Whole-grain or whole-wheat bread. Whole-grain or   whole-wheat pasta. Brown rice. Oatmeal. Quinoa. Bulgur. Whole-grain and low-sodium cereals. Pita bread. Low-fat, low-sodium crackers. Whole-wheat flour tortillas. Meats and other proteins Skinless chicken or turkey. Ground chicken or turkey. Pork with fat trimmed off. Fish and seafood. Egg whites. Dried beans, peas, or lentils. Unsalted nuts, nut butters, and seeds. Unsalted canned beans. Lean cuts of beef with fat trimmed off. Low-sodium, lean precooked or cured meat, such as sausages or meat loaves. Dairy Low-fat (1%) or fat-free (skim) milk. Reduced-fat, low-fat, or fat-free cheeses. Nonfat, low-sodium ricotta or cottage cheese. Low-fat or  nonfat yogurt. Low-fat, low-sodium cheese. Fats and oils Soft margarine without trans fats. Vegetable oil. Reduced-fat, low-fat, or light mayonnaise and salad dressings (reduced-sodium). Canola, safflower, olive, avocado, soybean, and sunflower oils. Avocado. Seasonings and condiments Herbs. Spices. Seasoning mixes without salt. Other foods Unsalted popcorn and pretzels. Fat-free sweets. The items listed above may not be a complete list of foods and beverages you can eat. Contact a dietitian for more information. What foods should I avoid? Fruits Canned fruit in a light or heavy syrup. Fried fruit. Fruit in cream or butter sauce. Vegetables Creamed or fried vegetables. Vegetables in a cheese sauce. Regular canned vegetables (not low-sodium or reduced-sodium). Regular canned tomato sauce and paste (not low-sodium or reduced-sodium). Regular tomato and vegetable juice (not low-sodium or reduced-sodium). Pickles. Olives. Grains Baked goods made with fat, such as croissants, muffins, or some breads. Dry pasta or rice meal packs. Meats and other proteins Fatty cuts of meat. Ribs. Fried meat. Bacon. Bologna, salami, and other precooked or cured meats, such as sausages or meat loaves. Fat from the back of a pig (fatback). Bratwurst. Salted nuts and seeds. Canned beans with added salt. Canned or smoked fish. Whole eggs or egg yolks. Chicken or turkey with skin. Dairy Whole or 2% milk, cream, and half-and-half. Whole or full-fat cream cheese. Whole-fat or sweetened yogurt. Full-fat cheese. Nondairy creamers. Whipped toppings. Processed cheese and cheese spreads. Fats and oils Butter. Stick margarine. Lard. Shortening. Ghee. Bacon fat. Tropical oils, such as coconut, palm kernel, or palm oil. Seasonings and condiments Onion salt, garlic salt, seasoned salt, table salt, and sea salt. Worcestershire sauce. Tartar sauce. Barbecue sauce. Teriyaki sauce. Soy sauce, including reduced-sodium. Steak sauce.  Canned and packaged gravies. Fish sauce. Oyster sauce. Cocktail sauce. Store-bought horseradish. Ketchup. Mustard. Meat flavorings and tenderizers. Bouillon cubes. Hot sauces. Pre-made or packaged marinades. Pre-made or packaged taco seasonings. Relishes. Regular salad dressings. Other foods Salted popcorn and pretzels. The items listed above may not be a complete list of foods and beverages you should avoid. Contact a dietitian for more information. Where to find more information National Heart, Lung, and Blood Institute: www.nhlbi.nih.gov American Heart Association: www.heart.org Academy of Nutrition and Dietetics: www.eatright.org National Kidney Foundation: www.kidney.org Summary The DASH eating plan is a healthy eating plan that has been shown to reduce high blood pressure (hypertension). It may also reduce your risk for type 2 diabetes, heart disease, and stroke. When on the DASH eating plan, aim to eat more fresh fruits and vegetables, whole grains, lean proteins, low-fat dairy, and heart-healthy fats. With the DASH eating plan, you should limit salt (sodium) intake to 2,300 mg a day. If you have hypertension, you may need to reduce your sodium intake to 1,500 mg a day. Work with your health care provider or dietitian to adjust your eating plan to your individual calorie needs. This information is not intended to replace advice given to you by your health care provider. Make sure   you discuss any questions you have with your health care provider. Document Revised: 08/25/2019 Document Reviewed: 08/25/2019 Elsevier Patient Education  2023 Elsevier Inc.  

## 2022-09-25 ENCOUNTER — Other Ambulatory Visit: Payer: Self-pay

## 2022-10-06 ENCOUNTER — Other Ambulatory Visit: Payer: Self-pay

## 2022-10-07 ENCOUNTER — Other Ambulatory Visit: Payer: Self-pay

## 2022-10-08 ENCOUNTER — Other Ambulatory Visit: Payer: Self-pay

## 2022-10-09 ENCOUNTER — Other Ambulatory Visit: Payer: Self-pay

## 2022-10-12 ENCOUNTER — Other Ambulatory Visit: Payer: Self-pay

## 2022-10-27 NOTE — Congregational Nurse Program (Signed)
  Dept: [phone]   Congregational Nurse Program Note  Date of Encounter: 10/27/2022 Client to [facility] day center to advise RN that he had received a letter stating that he now has Medicaid. He has not received a card. Insurance verified with [employer] navigator, effective 09/04/2022.. [place]. Client wishes to contact [employer]. RN to assist as needed. Client will most likely need prescription refills prior to his new patient PCP appointment. RN to provided assistance. Client to return to clinic for assistance. Support given. Past Medical History: Past Medical History:  Diagnosis Date   Hypertension     Encounter Details:  CNP Questionnaire - 10/27/22 0910       Questionnaire   Ask client: Do you give verbal consent for me to treat you today? Yes    Student Assistance N/A    Location Patient [place]    Visit Setting with Client Organization    Patient Status Unhoused   staying "here and there"   Insurance Medicaid   client now has Medicaid, verified with [facility] financial navigator   Insurance/Financial Assistance Referral N/A    Medication N/A   will need assistance with refills   Medical Provider Yes   client now has Medicaid, will now need a new PCP.   Screening Referrals Made N/A    Medical Referrals Made N/A    Medical Appointment Made N/A   client needed his lower extremitiy ultrasound rescheduled as he missed it yesterday. apt made for 12/1 at 2:30   Recently w/o PCP, now 1st time PCP visit completed due to CNs referral or appointment made N/A   client did attend his first new [facility]   Food N/A   has food stamps 281.00/month   Transportation N/A   using the [employer] bus   Housing/Utilities No permanent housing    Chiropractor N/A    Interventions Advocate/Support;Educate;Navigate Healthcare System;Case Management    Abnormal to Normal Screening Since Last CN Visit N/A    Screenings CN Performed  N/A    Sent Client to Lab for: N/A    Did client attend any of the following based off CNs referral or appointments made? N/A   [facility]   ED Visit Averted N/A    Life-Saving Intervention Made N/A

## 2022-10-29 NOTE — Congregational Nurse Program (Signed)
  Dept: [phone]   Congregational Nurse Program Note  Date of Encounter: 10/29/2022 Client to [facility] day center with request for assistance in setting up a new PCP. Medicaid has been verified. RN contacted his insurance company and confirmed that his card has been mailed. RN contacted both [facility] and [facility], neither have their calendars out for March until 2/1 RN to contact [facility] on 2/1. Client aware. RN to assist with refills of medication during the gap in services from [facility] to a new PCP. Client appreciative of assistance provided. Past Medical History: Past Medical History:  Diagnosis Date   Hypertension     Encounter Details:  CNP Questionnaire - 10/29/22 0930       Questionnaire   Ask client: Do you give verbal consent for me to treat you today? Yes    Student Assistance N/A    Location Patient [place]    Visit Setting with Client Organization    Patient Status Unhoused   staying "here and there"   Insurance Medicaid   client now has Medicaid, verified with [facility] financial navigator   Insurance/Financial Assistance Referral N/A    Medication N/A   will need assistance with refills in the gap before he can be seen by a new PCP   Medical Provider Yes   client now has Medicaid, will now need a new PCP.   Screening Referrals Made N/A    Medical Referrals Made N/A    Medical Appointment Made N/A   Attempted  to make an appointment for cliet at [facility] cannot make an appointment until 2/1 for March   Recently w/o PCP, now 1st time PCP visit completed due to CNs referral or appointment made N/A   Client plans to be set up at [facility] with assistance from [place]   has food stamps 281.00/month   Transportation N/A   using the [employer] bus   Housing/Utilities No permanent housing    Chiropractor N/A    Interventions  Advocate/Support;Educate;Navigate Healthcare System;Case Management    Abnormal to Normal Screening Since Last CN Visit N/A    Screenings CN Performed N/A    Sent Client to Lab for: N/A    Did client attend any of the following based off CNs referral or appointments made? N/A   [facility]   ED Visit Averted N/A    Life-Saving Intervention Made N/A

## 2022-11-03 ENCOUNTER — Other Ambulatory Visit: Payer: Self-pay | Admitting: Critical Care Medicine

## 2022-11-03 ENCOUNTER — Other Ambulatory Visit: Payer: Self-pay

## 2022-11-03 DIAGNOSIS — E785 Hyperlipidemia, unspecified: Secondary | ICD-10-CM

## 2022-11-03 DIAGNOSIS — I10 Essential (primary) hypertension: Secondary | ICD-10-CM

## 2022-11-03 DIAGNOSIS — R7303 Prediabetes: Secondary | ICD-10-CM

## 2022-11-03 MED ORDER — METFORMIN HCL 1000 MG PO TABS
500.0000 mg | ORAL_TABLET | Freq: Two times a day (BID) | ORAL | 0 refills | Status: DC
Start: 2022-11-03 — End: 2023-01-31
  Filled 2022-11-03: qty 30, 30d supply, fill #0
  Filled 2022-12-01: qty 30, 30d supply, fill #1
  Filled 2023-01-05: qty 30, 30d supply, fill #2

## 2022-11-03 MED ORDER — ATORVASTATIN CALCIUM 10 MG PO TABS
10.0000 mg | ORAL_TABLET | Freq: Every day | ORAL | 2 refills | Status: DC
Start: 2022-11-03 — End: 2023-03-12
  Filled 2022-11-03 – 2023-01-05 (×2): qty 90, 90d supply, fill #0

## 2022-11-03 MED ORDER — AMLODIPINE BESYLATE 5 MG PO TABS
5.0000 mg | ORAL_TABLET | Freq: Every day | ORAL | 0 refills | Status: DC
  Filled 2022-11-03: qty 90, 90d supply, fill #0

## 2022-11-03 NOTE — Congregational Nurse Program (Signed)
  Dept: [phone]   Congregational Nurse Program Note  Date of Encounter: 11/03/2022 Client to [facility] day center for assistance with mediation refills. He also brought his new insurance card. Card copied and placed in patient chart. RN provided assistance with medication refills by contacting Dr.Patrick Joya Gaskins. Client is currently between primary care providers as he now has Medicaid and cannot go to [facility]. Plan is for RN to contact [facility] on 2/1 to attempt to set him up with a PCP. Per conversation last week with [facility], they should have their new schedule out by then for March. Client appreciative of assistance provided. Refills will be available at [place]. Past Medical History: Past Medical History:  Diagnosis Date   Hypertension     Encounter Details:  CNP Questionnaire - 11/03/22 0940       Questionnaire   Ask client: Do you give verbal consent for me to treat you today? Yes    Student Assistance N/A    Location Patient [place]    Visit Setting with Client Organization    Patient Status Unhoused   staying "here and there"   Insurance Medicaid   client now has Medicaid, verified with [employer] financial navigator. [employer]   Insurance/Financial Assistance Referral N/A    Medication N/A   RN contacted Dr. Asencion Noble for refill son lisinopril and Metformin. Rx to go to [place] Provider Yes   client now has Medicaid, will now need a new PCP. RN to contact [employer] on 2/1 per thier request   Screening Referrals Made N/A    Medical Referrals Made N/A    Medical Appointment Made N/A   Attempted  to make an appointment for client at [facility], cannot make an appointment until 2/1 for March   Recently w/o PCP, now 1st time PCP visit completed due to CNs referral or appointment made N/A   Client plans to be set up  at [facility] with assistance from [place]   has food stamps 281.00/month   Transportation N/A   using the [employer] bus   Housing/Utilities No permanent housing    Chiropractor N/A    Interventions Advocate/Support;Educate;Navigate Healthcare System;Case Management    Abnormal to Normal Screening Since Last CN Visit N/A    Screenings CN Performed N/A    Sent Client to Lab for: N/A    Did client attend any of the following based off CNs referral or appointments made? N/A   [facility]   ED Visit Averted N/A    Life-Saving Intervention Made N/A

## 2022-11-03 NOTE — Progress Notes (Signed)
Med refills

## 2022-11-05 NOTE — Congregational Nurse Program (Signed)
  Dept: [phone]   Congregational Nurse Program Note  Date of Encounter: 11/05/2022 Client to [facility] day center for assistance with setting up a new PCP as he now has [place]. RN contacted [employer], still not taking any new patients, neither is [facility]. [place] [facility] does not take this Medicaid. Apt made at [facility] for 01/29/23 at 10 am. Client is aware. No no other needs at this time. Past Medical History: Past Medical History:  Diagnosis Date   Hypertension     Encounter Details:  CNP Questionnaire - 11/05/22 0915       Questionnaire   Ask client: Do you give verbal consent for me to treat you today? Yes    Student Assistance N/A    Location Patient [place]    Visit Setting with Client Organization    Patient Status Unhoused   staying "here and there"   Insurance Medicaid   client now has Medicaid, verified with [employer] financial navigator. [employer]   Insurance/Financial Assistance Referral N/A    Medication N/A    Medical Provider No   Client now has Medicaid, new pateint apt made at [facility] practice for 4/26 at 10:00 am. Client aware.   Screening Referrals Made N/A    Medical Referrals Made N/A    Medical Appointment Made [facility] PCP[facility]clinic   New pateint apt made on 2/1, first available 01/29/23 at 10:00 am   Recently w/o PCP, now 1st time PCP visit completed due to CNs referral or appointment made N/A   New patient apt set at [facility] practice for 4/26 at 10am, first available   Food N/A   has food stamps 281.00/month   Transportation N/A   using the [employer] bus. RN to provide bus passess for Md apts as needed   Housing/Utilities No permanent housing    Interpersonal Safety N/A    Interventions Advocate/Support;Educate;Navigate Healthcare System;Case Management    Abnormal to Normal Screening Since Last CN Visit N/A    Screenings CN Performed N/A    Sent Client to Lab  for: N/A    Did client attend any of the following based off CNs referral or appointments made? N/A   [facility]   ED Visit Averted N/A    Life-Saving Intervention Made N/A

## 2022-11-11 ENCOUNTER — Other Ambulatory Visit: Payer: Self-pay

## 2022-11-17 ENCOUNTER — Ambulatory Visit: Payer: Self-pay | Admitting: Gerontology

## 2022-12-01 ENCOUNTER — Other Ambulatory Visit: Payer: Self-pay

## 2022-12-01 NOTE — Congregational Nurse Program (Signed)
  Dept: [phone]   Congregational Nurse Program Note  Date of Encounter: 12/01/2022 Client to [facility] day center with his medications to show which one he needed refilled. Refill for Metformin called to [facility]. No other needs at this time. Past Medical History: Past Medical History:  Diagnosis Date   Hypertension     Encounter Details:  CNP Questionnaire - 12/01/22 1039       Questionnaire   Ask client: Do you give verbal consent for me to treat you today? Yes    Student Assistance N/A    Location Patient [place]    Visit Setting with Client Organization    Patient Status Unhoused   staying "here and there"   Insurance Medicaid   client now has Medicaid, verified with [employer] financial navigator. [employer]. cards copied and placed in client's chart   Insurance/Financial Assistance Referral N/A    Medication N/A    Medical Provider No   Client now has Medicaid, new pateint apt made at [facility] practice for 4/26 at 10:00 am. Client aware.   Screening Referrals Made N/A    Medical Referrals Made N/A    Medical Appointment Made [facility] PCP[facility]clinic   New pateint apt made on 2/1, first available 01/29/23 at 10:00 am   Recently w/o PCP, now 1st time PCP visit completed due to CNs referral or appointment made N/A   New patient apt set at [facility] practice for 4/26 at 10am, first available   Food N/A   has food stamps 281.00/month   Transportation N/A   using the [employer] bus. RN to provide bus passess for Md apts as needed   Housing/Utilities No permanent housing    Interpersonal Safety N/A    Interventions Advocate/Support;Navigate Healthcare System    Abnormal to Normal Screening Since Last CN Visit N/A    Screenings CN Performed N/A    Sent Client to Lab for: N/A    Did client attend any of the following based off CNs referral or appointments made? N/A   [facility]   ED Visit Averted N/A    Life-Saving Intervention  Made N/A

## 2022-12-03 NOTE — Congregational Nurse Program (Signed)
  Dept: [phone]   Congregational Nurse Program Note  Date of Encounter: 12/03/2022 Client to [facility] day center with questions about a phone call he had received from his insurance company. RN explained that this was a customer services call to explain his services and give him contact information. Numbers verified from the back of his insurance card. RN also assisted client with contacting his insurance company to change the PCP listed on his card. New card to be mailed to him at his [address] address. Client very appreciative of assistance. No other needs at this time. Past Medical History: Past Medical History:  Diagnosis Date   Hypertension     Encounter Details:  CNP Questionnaire - 12/03/22 1316       Questionnaire   Ask client: Do you give verbal consent for me to treat you today? Yes    Student Assistance N/A    Location Patient [place]    Visit Setting with Client Organization    Patient Status Unhoused   staying "here and there"   Insurance Medicaid   client now has Medicaid, verified with [employer] financial navigator. [employer]. cards copied and placed in client's chart   Insurance/Financial Assistance Referral N/A    Medication N/A    Medical Provider No   Client now has Medicaid, new pateint apt made at [facility] practice for 4/26 at 10:00 am. Client aware.   Screening Referrals Made N/A    Medical Referrals Made N/A    Medical Appointment Made [facility] PCP[facility]clinic   New pateint apt made on 2/1, first available 01/29/23 at 10:00 am   Recently w/o PCP, now 1st time PCP visit completed due to CNs referral or appointment made N/A   New patient apt set at [facility] practice for 4/26 at 10am, first available   Food N/A   has food stamps 281.00/month   Transportation N/A   using the [employer] bus. RN to provide bus passess for Md apts as needed   Housing/Utilities No permanent housing    Interpersonal Safety N/A    Interventions  Advocate/Support;Navigate Healthcare System    Abnormal to Normal Screening Since Last CN Visit N/A    Screenings CN Performed N/A    Sent Client to Lab for: N/A    Did client attend any of the following based off CNs referral or appointments made? N/A   [facility]   ED Visit Averted N/A    Life-Saving Intervention Made N/A

## 2023-01-05 ENCOUNTER — Other Ambulatory Visit: Payer: Self-pay

## 2023-01-13 ENCOUNTER — Other Ambulatory Visit: Payer: Self-pay

## 2023-01-18 ENCOUNTER — Other Ambulatory Visit: Payer: Self-pay

## 2023-01-26 NOTE — Congregational Nurse Program (Signed)
  Dept: [phone]   Congregational Nurse Program Note  Date of Encounter: 01/26/2023 Client to [facility] day center for reassurance regarding his upcoming new patient appointment at [facility]. Apt is on Friday 4/26.  Card given with Md office phone number and address. No other needs at this time. Francesco Runner BSN, RN Past Medical History: Past Medical History:  Diagnosis Date   Hypertension     Encounter Details:  CNP Questionnaire - 01/26/23 1152       Questionnaire   Ask client: Do you give verbal consent for me to treat you today? Yes    Student Assistance N/A    Location Patient Served  [facility]    Visit Setting with Client Organization    Patient Status Unknown   client lives with his friend Jeff   Insurance [place]   client now has Medicaid, verified with [employer] financial navigator. [employer]. cards copied and placed in client's chart   Insurance/Financial Assistance Referral N/A    Medication N/A    Medical Provider No   Client now has Medicaid, new patient apt made at [facility] practice for 4/26 at 10:00 am. Client aware.   Screening Referrals Made N/A    Medical Referrals Made N/A    Medical Appointment Made [facility] PCP[facility]clinic   New pateint apt made on 2/1, first available 01/29/23 at 10:00 am   Recently w/o PCP, now 1st time PCP visit completed due to CNs referral or appointment made N/A   New patient apt set at [facility] practice for 4/26 at 10am, first available   Food N/A   has food stamps 281.00/month   Transportation N/A   using the [employer] bus. RN to provide bus passess for Md apts as needed   Housing/Utilities No permanent housing    Interpersonal Safety N/A    Interventions Advocate/Support;Navigate Healthcare System    Abnormal to Normal Screening Since Last CN Visit N/A    Screenings CN Performed N/A    Sent Client to Lab for: N/A    Did client attend any of the following based off CNs referral or  appointments made? N/A   [facility]   ED Visit Averted N/A    Life-Saving Intervention Made N/A

## 2023-01-28 ENCOUNTER — Telehealth: Payer: Self-pay | Admitting: Family Medicine

## 2023-01-28 NOTE — Progress Notes (Signed)
I,Calvin Wheeler,acting as a scribe for Calvin Kindle, Calvin Wheeler.,have documented all relevant documentation on the behalf of Calvin Kindle, Calvin Wheeler,as directed by  Calvin Kindle, Calvin Wheeler while in the presence of Calvin Kindle, Calvin Wheeler.  New patient visit  Patient: Calvin Wheeler   DOB: 07-20-1961   62 y.o. Male  MRN: [id number] Visit Date: 01/29/2023  Today's healthcare provider: Jacky Kindle, Calvin Wheeler  Patient presents for new patient visit to establish care.  Introduced to Publishing rights manager role and practice setting.  All questions answered.  Discussed provider/patient relationship and expectations.  Chief Complaint  Patient presents with   Establish Care   Subjective    Calvin Wheeler is a 62 y.o. male who presents today as a new patient to establish care.  HPI   Past Medical History:  Diagnosis Date   Hypertension    Past Surgical History:  Procedure Laterality Date   COLONOSCOPY WITH PROPOFOL N/A 11/03/2018   Procedure: COLONOSCOPY WITH PROPOFOL;  Surgeon: Toney Reil, MD;  Location: [facility] ENDOSCOPY;  Service: Gastroenterology;  Laterality: N/A;   COLONOSCOPY WITH PROPOFOL N/A 11/04/2018   Procedure: COLONOSCOPY WITH PROPOFOL;  Surgeon: Wyline Mood, MD;  Location: [facility] ENDOSCOPY;  Service: Gastroenterology;  Laterality: N/A;   HERNIA REPAIR     MANDIBLE FRACTURE SURGERY     around the age of 61   Family Status  Relation Name Status   Mother  Alive   Father  Deceased   Sister  Deceased   Sister  Deceased   Sister  Alive   Brother  Deceased   Brother  Alive   Brother  Deceased   MGM  Deceased   MGF  Deceased   PGM  Deceased   PGF  Deceased   Family History  Problem Relation Age of Onset   Hypertension Mother        lives in a 'rest home'   Stroke Mother    Hypertension Father    Seizures Sister    Other Sister        unknown medical history   Hypertension Sister    Cancer Brother    Prostate cancer Brother    Other Brother        MVA   Other Maternal  Grandmother        unknown medical history   Other Maternal Grandfather        unknown medical history   Other Paternal Grandmother        unknown medical history   Other Paternal Grandfather        unknown medical history   Social History   Socioeconomic History   Marital status: Single    Spouse name: Not on file   Number of children: Not on file   Years of education: Not on file   Highest education level: Not on file  Occupational History   Not on file  Tobacco Use   Smoking status: Former    Packs/day: .5    Types: Cigars, Cigarettes    Quit date: 07/2022    Years since quitting: 0.5   Smokeless tobacco: Never   Tobacco comments:    Smokes 1 black and mild/day 05/08/21    09/02/22 Patient is homeless, goes to a shelter or couch surfing  Vaping Use   Vaping Use: Never used  Substance and Sexual Activity   Alcohol use: No   Drug use: Not Currently    Types: "Crack" cocaine    Comment:  last use ~2013   Sexual activity: Not on file  Other Topics Concern   Not on file  Social History Narrative   Not on file   Social Determinants of Health   Financial Resource Strain: Not on file  Food Insecurity: No Food Insecurity (09/02/2022)   Hunger Vital Sign    Worried About Running Out of Food in the Last Year: Never true    Ran Out of Food in the Last Year: Never true  Transportation Needs: No Transportation Needs (09/02/2022)   PRAPARE - Administrator, Civil Service (Medical): No    Lack of Transportation (Non-Medical): No  Physical Activity: Not on file  Stress: Not on file  Social Connections: Not on file   Outpatient Medications Prior to Visit  Medication Sig   albuterol (VENTOLIN HFA) 108 (90 Base) MCG/ACT inhaler Inhale 2 puffs into the lungs every 6 (six) hours as needed for wheezing or shortness of breath.   atorvastatin (LIPITOR) 10 MG tablet Take 1 tablet (10 mg total) by mouth daily.   metFORMIN (GLUCOPHAGE) 1000 MG tablet Take 0.5 tablets (500  mg total) by mouth 2 (two) times daily with a meal.   [DISCONTINUED] amLODipine (NORVASC) 5 MG tablet Take 1 tablet (5 mg total) by mouth daily.   No facility-administered medications prior to visit.   Allergies  Allergen Reactions   Sulfa Antibiotics Other (See Comments)    Unsure    Immunization History  Administered Date(s) Administered   Influenza,inj,Quad PF,6+ Mos 09/02/2022    Health Maintenance  Topic Date Due   COVID-19 Vaccine (1) Never done   HIV Screening  Never done   Hepatitis C Screening  Never done   DTaP/Tdap/Td (1 - Tdap) Never done   Zoster Vaccines- Shingrix (1 of 2) Never done   INFLUENZA VACCINE  05/06/2023   COLONOSCOPY (Pts 45-31yrs Insurance coverage will need to be confirmed)  11/04/2028   HPV VACCINES  Aged Out    Patient Care Team: Calvin Kindle, Calvin Wheeler as PCP - General (Family Medicine)  Review of Systems  Constitutional:  Positive for diaphoresis and fatigue.  HENT:  Positive for sinus pressure.   Gastrointestinal:  Positive for constipation.  Skin:  Positive for rash.  Psychiatric/Behavioral:  Positive for agitation.     Objective    BP 126/83 (BP Location: Right Arm, Patient Position: Sitting, Cuff Size: Large)   Pulse 69   Temp 97.9 F (36.6 C) (Oral)   Resp 16   Ht 6\' 2"  (1.88 m)   Wt 288 lb 4.8 oz (130.8 kg)   SpO2 98%   BMI 37.02 kg/m   Physical Exam Vitals and nursing note reviewed.  Constitutional:      Appearance: Normal appearance. He is obese.  HENT:     Head: Normocephalic and atraumatic.  Cardiovascular:     Rate and Rhythm: Normal rate and regular rhythm.     Pulses: Normal pulses.     Heart sounds: Normal heart sounds.  Pulmonary:     Effort: Pulmonary effort is normal.     Breath sounds: Normal breath sounds.  Abdominal:     General: There is distension.     Palpations: Abdomen is soft.     Tenderness: There is abdominal tenderness. There is guarding. There is no right CVA tenderness or left CVA tenderness.      Hernia: A hernia is present.  Musculoskeletal:        General: Normal range of motion.  Cervical back: Normal range of motion.     Right lower leg: Edema present.     Left lower leg: Edema present.     Comments: +1 edema to BLE; discussed concern with norvasc use and can further evaluate if patient wishes to change antihypertensive regimen; however, currently working well  Skin:    General: Skin is warm and dry.     Capillary Refill: Capillary refill takes less than 2 seconds.  Neurological:     General: No focal deficit present.     Mental Status: He is alert and oriented to person, place, and time. Mental status is at baseline.  Psychiatric:        Mood and Affect: Mood normal.        Behavior: Behavior normal.        Thought Content: Thought content normal.        Judgment: Judgment normal.    Depression Screen    01/29/2023    9:54 AM 09/02/2022   10:01 AM  PHQ 2/9 Scores  PHQ - 2 Score 3 0  PHQ- 9 Score 11    No results found for any visits on 01/29/23.  Assessment & Plan      Problem List Items Addressed This Visit       Cardiovascular and Mediastinum   Essential hypertension    Chronic, stable Continue norvasc at 5 mg Recommend labs to assist with further titration Continue to monitor LE edema       Relevant Medications   amLODipine (NORVASC) 5 MG tablet     Digestive   Metabolic dysfunction-associated steatotic liver disease (MASLD)    Chronic, stable Repeated CMP today Continue to monitor use of statin and diet/exercise Body mass index is 37.02 kg/m. Discussed importance of healthy weight management Discussed diet and exercise       Relevant Orders   Comprehensive Metabolic Panel (CMET)     Other   Bilateral leg edema    Chronic, stable With concern for infection or poor blood flow Continue to monitor in setting of norvasc 5 mg      Encounter for hepatitis C screening test for low risk patient    Low risk screen Treatable, and  curable. If left untreated Hep C can lead to cirrhosis and liver failure. Encourage routine testing; recommend repeat testing if risk factors change.       Relevant Orders   Hepatitis C Antibody   Encounter for medical examination to establish care - Primary    Wishes to establish care given hx of DM, HTN, HLD Things to do to keep yourself healthy  - Exercise at least 30-45 minutes a day, 3-4 days a week.  - Eat a low-fat diet with lots of fruits and vegetables, up to 7-9 servings per day.  - Seatbelts can save your life. Wear them always.  - Smoke detectors on every level of your home, check batteries every year.  - Eye Doctor - have an eye exam every 1-2 years  - Safe sex - if you may be exposed to STDs, use a condom.  - Alcohol -  If you drink, do it moderately, less than 2 drinks per day.  - Health Care Power of Attorney. Choose someone to speak for you if you are not able.  - Depression is common in our stressful world.If you're feeling down or losing interest in things you normally enjoy, please come in for a visit.  - Violence - If anyone is threatening or  hurting you, please call immediately.       Encounter for screening for HIV    Low risk screen Consented; encouraged to "know your status" Recommend repeat screen if risk factors change       Relevant Orders   HIV antibody (with reflex)   Hernia of abdominal wall    Chronic, previously repaired Concern for diastasis recti 3+ fingers       Relevant Orders   Ambulatory referral to General Surgery   Mixed hyperlipidemia    Chronic, unknown Is on liptior 10 mg  LDL goal of <100 with out hx of DM or CVA       Relevant Medications   amLODipine (NORVASC) 5 MG tablet   Other Relevant Orders   Lipid panel   Prediabetes    Chronic, stable Is on 500 mg metformin BID Continue to recommend balanced, lower carb meals. Smaller meal size, adding snacks. Choosing water as drink of choice and increasing purposeful  exercise. Repeat A1c Reports some GI complaints with use of medication; continue to monitor.      Relevant Orders   Hemoglobin A1c   Screening PSA (prostate specific antigen)   Relevant Orders   PSA   Return in about 6 weeks (around 03/12/2023) for anxiety and depression- celexa start .    Calvin Merl, Calvin Wheeler, have reviewed all documentation for this visit. The documentation on 01/29/23 for the exam, diagnosis, procedures, and orders are all accurate and complete.  Calvin Kindle, Calvin Wheeler  [facility] [phone] (phone) [phone] (fax)  [facility]

## 2023-01-29 ENCOUNTER — Encounter: Payer: Self-pay | Admitting: Family Medicine

## 2023-01-29 ENCOUNTER — Ambulatory Visit (INDEPENDENT_AMBULATORY_CARE_PROVIDER_SITE_OTHER): Payer: Medicaid Other | Admitting: Family Medicine

## 2023-01-29 VITALS — BP 126/83 | HR 69 | Temp 97.9°F | Resp 16 | Ht 74.0 in | Wt 288.3 lb

## 2023-01-29 DIAGNOSIS — Z125 Encounter for screening for malignant neoplasm of prostate: Secondary | ICD-10-CM

## 2023-01-29 DIAGNOSIS — Z1159 Encounter for screening for other viral diseases: Secondary | ICD-10-CM | POA: Diagnosis not present

## 2023-01-29 DIAGNOSIS — K760 Fatty (change of) liver, not elsewhere classified: Secondary | ICD-10-CM

## 2023-01-29 DIAGNOSIS — K439 Ventral hernia without obstruction or gangrene: Secondary | ICD-10-CM

## 2023-01-29 DIAGNOSIS — Z0000 Encounter for general adult medical examination without abnormal findings: Secondary | ICD-10-CM

## 2023-01-29 DIAGNOSIS — Z114 Encounter for screening for human immunodeficiency virus [HIV]: Secondary | ICD-10-CM

## 2023-01-29 DIAGNOSIS — R600 Localized edema: Secondary | ICD-10-CM

## 2023-01-29 DIAGNOSIS — E782 Mixed hyperlipidemia: Secondary | ICD-10-CM

## 2023-01-29 DIAGNOSIS — R7303 Prediabetes: Secondary | ICD-10-CM

## 2023-01-29 DIAGNOSIS — I10 Essential (primary) hypertension: Secondary | ICD-10-CM

## 2023-01-29 MED ORDER — CITALOPRAM HYDROBROMIDE 20 MG PO TABS
20.0000 mg | ORAL_TABLET | Freq: Every day | ORAL | 3 refills | Status: DC
Start: 2023-01-29 — End: 2023-03-12

## 2023-01-29 MED ORDER — AMLODIPINE BESYLATE 5 MG PO TABS
5.0000 mg | ORAL_TABLET | Freq: Every day | ORAL | 3 refills | Status: DC
Start: 2023-01-29 — End: 2023-05-10

## 2023-01-29 NOTE — Assessment & Plan Note (Signed)
Chronic, stable Continue norvasc at 5 mg Recommend labs to assist with further titration Continue to monitor LE edema

## 2023-01-29 NOTE — Assessment & Plan Note (Signed)
Low risk screen Treatable, and curable. If left untreated Hep C can lead to cirrhosis and liver failure. Encourage routine testing; recommend repeat testing if risk factors change.  

## 2023-01-29 NOTE — Assessment & Plan Note (Signed)
Chronic, stable Repeated CMP today Continue to monitor use of statin and diet/exercise Body mass index is 37.02 kg/m. Discussed importance of healthy weight management Discussed diet and exercise

## 2023-01-29 NOTE — Assessment & Plan Note (Signed)
Chronic, previously repaired Concern for diastasis recti 3+ fingers

## 2023-01-29 NOTE — Assessment & Plan Note (Signed)
Chronic, stable With concern for infection or poor blood flow Continue to monitor in setting of norvasc 5 mg

## 2023-01-29 NOTE — Assessment & Plan Note (Signed)
Low risk screen ?Consented; encouraged to "know your status" ?Recommend repeat screen if risk factors change ? ?

## 2023-01-29 NOTE — Assessment & Plan Note (Signed)
Wishes to establish care given hx of DM, HTN, HLD Things to do to keep yourself healthy  - Exercise at least 30-45 minutes a day, 3-4 days a week.  - Eat a low-fat diet with lots of fruits and vegetables, up to 7-9 servings per day.  - Seatbelts can save your life. Wear them always.  - Smoke detectors on every level of your home, check batteries every year.  - Eye Doctor - have an eye exam every 1-2 years  - Safe sex - if you may be exposed to STDs, use a condom.  - Alcohol -  If you drink, do it moderately, less than 2 drinks per day.  - Health Care Power of Attorney. Choose someone to speak for you if you are not able.  - Depression is common in our stressful world.If you're feeling down or losing interest in things you normally enjoy, please come in for a visit.  - Violence - If anyone is threatening or hurting you, please call immediately.

## 2023-01-29 NOTE — Assessment & Plan Note (Signed)
Chronic, unknown Is on liptior 10 mg  LDL goal of <100 with out hx of DM or CVA

## 2023-01-29 NOTE — Assessment & Plan Note (Signed)
Chronic, stable Is on 500 mg metformin BID Continue to recommend balanced, lower carb meals. Smaller meal size, adding snacks. Choosing water as drink of choice and increasing purposeful exercise. Repeat A1c Reports some GI complaints with use of medication; continue to monitor.

## 2023-01-30 LAB — COMPREHENSIVE METABOLIC PANEL
ALT: 19 IU/L (ref 0–44)
AST: 17 IU/L (ref 0–40)
Albumin/Globulin Ratio: 1.6 (ref 1.2–2.2)
Albumin: 4.4 g/dL (ref 3.9–4.9)
Alkaline Phosphatase: 94 IU/L (ref 44–121)
BUN/Creatinine Ratio: 10 (ref 10–24)
BUN: 10 mg/dL (ref 8–27)
Bilirubin Total: <0.2 mg/dL (ref 0.0–1.2)
CO2: 22 mmol/L (ref 20–29)
Calcium: 9.7 mg/dL (ref 8.6–10.2)
Chloride: 103 mmol/L (ref 96–106)
Creatinine, Ser: 0.97 mg/dL (ref 0.76–1.27)
Globulin, Total: 2.8 g/dL (ref 1.5–4.5)
Glucose: 103 mg/dL — ABNORMAL HIGH (ref 70–99)
Potassium: 4.3 mmol/L (ref 3.5–5.2)
Sodium: 142 mmol/L (ref 134–144)
Total Protein: 7.2 g/dL (ref 6.0–8.5)
eGFR: 88 mL/min/{1.73_m2} (ref >59)

## 2023-01-30 LAB — LIPID PANEL
Chol/HDL Ratio: 5 ratio (ref 0.0–5.0)
Cholesterol, Total: 145 mg/dL (ref 100–199)
HDL: 29 mg/dL — ABNORMAL LOW (ref >39)
LDL Chol Calc (NIH): 85 mg/dL (ref 0–99)
Triglycerides: 179 mg/dL — ABNORMAL HIGH (ref 0–149)
VLDL Cholesterol Cal: 31 mg/dL (ref 5–40)

## 2023-01-30 LAB — PSA: Prostate Specific Ag, Serum: 7.2 ng/mL — ABNORMAL HIGH (ref 0.0–4.0)

## 2023-01-30 LAB — HEMOGLOBIN A1C
Est. average glucose Bld gHb Est-mCnc: 137 mg/dL
Hgb A1c MFr Bld: 6.4 % — ABNORMAL HIGH (ref 4.8–5.6)

## 2023-01-30 LAB — HIV ANTIBODY (ROUTINE TESTING W REFLEX): HIV Screen 4th Generation wRfx: NONREACTIVE

## 2023-01-30 LAB — HEPATITIS C ANTIBODY: Hep C Virus Ab: NONREACTIVE

## 2023-01-31 ENCOUNTER — Other Ambulatory Visit: Payer: Self-pay | Admitting: Family Medicine

## 2023-01-31 DIAGNOSIS — R972 Elevated prostate specific antigen [PSA]: Secondary | ICD-10-CM

## 2023-01-31 MED ORDER — METFORMIN HCL ER 500 MG PO TB24: 1000.0000 mg | ORAL_TABLET | Freq: Two times a day (BID) | ORAL | 2 refills | Status: DC

## 2023-01-31 MED ORDER — FENOFIBRATE 145 MG PO TABS
145.0000 mg | ORAL_TABLET | Freq: Every day | ORAL | 2 refills | Status: DC
Start: 2023-01-31 — End: 2023-05-10

## 2023-01-31 NOTE — Progress Notes (Signed)
Recommend urology referral given elevated PSA.  Blood chemistry shows stabilized enzymes.  Cholesterol is improved. LDL and total cholesterol improved. Elevated fats remain. Stroke and MI risk remains elevated at 25%. I continue to recommend diet low in saturated fat and regular exercise - 30 min at least 5 times per week  The 10-year ASCVD risk score (Arnett DK, et al., 2019) is: 24.5%   Values used to calculate the score:     Age: 62 years     Sex: Male     Is Non-Hispanic African American: Yes     Diabetic: No     Tobacco smoker: Yes     Systolic Blood Pressure: 126 mmHg     Is BP treated: Yes     HDL Cholesterol: 29 mg/dL     Total Cholesterol: 145 mg/dL  Prediabetes remains unchanged; recommend titration of metformin from 500 to 1000 mg BID to assist diet/exercise.

## 2023-02-08 ENCOUNTER — Telehealth: Payer: Self-pay | Admitting: Family Medicine

## 2023-02-08 NOTE — Telephone Encounter (Signed)
Pt. Given lab results and instructions, verbalizes understanding. 

## 2023-02-15 NOTE — Progress Notes (Unsigned)
Patient ID: Calvin Wheeler, male   DOB: 01/21/1961, 62 y.o.   MRN: [id number]  Chief Complaint: Left lower quadrant pain with bulge  History of Present Illness Calvin Wheeler is a 62 y.o. male with intermittent presentation of left groin/lower abdominal bulge, typically presented while the patient is walking.  Excruciatingly painful requires laying in a recumbent position for reduction.  He has a prior history of umbilical hernia repair, uncertain if you utilized mesh at that time.  The pain seems to be localized at the periphery of that prior repair, and unfortunately today is unable to reproduce the bulge for me.  The pain has been quite debilitating, he describes a reduction maneuvering as the only way he can get relief.  Past Medical History Past Medical History:  Diagnosis Date   Hypertension       Past Surgical History:  Procedure Laterality Date   COLONOSCOPY WITH PROPOFOL N/A 11/03/2018   Procedure: COLONOSCOPY WITH PROPOFOL;  Surgeon: Toney Reil, MD;  Location: [facility] ENDOSCOPY;  Service: Gastroenterology;  Laterality: N/A;   COLONOSCOPY WITH PROPOFOL N/A 11/04/2018   Procedure: COLONOSCOPY WITH PROPOFOL;  Surgeon: Wyline Mood, MD;  Location: [facility] ENDOSCOPY;  Service: Gastroenterology;  Laterality: N/A;   HERNIA REPAIR     MANDIBLE FRACTURE SURGERY     around the age of 38    Allergies  Allergen Reactions   Sulfa Antibiotics Other (See Comments)    Unsure    Current Outpatient Medications  Medication Sig Dispense Refill   albuterol (VENTOLIN HFA) 108 (90 Base) MCG/ACT inhaler Inhale 2 puffs into the lungs every 6 (six) hours as needed for wheezing or shortness of breath. 6.7 g 0   amLODipine (NORVASC) 5 MG tablet Take 1 tablet (5 mg total) by mouth daily. 100 tablet 3   atorvastatin (LIPITOR) 10 MG tablet Take 1 tablet (10 mg total) by mouth daily. 90 tablet 2   citalopram (CELEXA) 20 MG tablet Take 1 tablet (20 mg total) by mouth daily. 100 tablet 3    fenofibrate (TRICOR) 145 MG tablet Take 1 tablet (145 mg total) by mouth daily. 30 tablet 2   metFORMIN (GLUCOPHAGE-XR) 500 MG 24 hr tablet Take 2 tablets (1,000 mg total) by mouth 2 (two) times daily with a meal. 120 tablet 2   No current facility-administered medications for this visit.    Family History Family History  Problem Relation Age of Onset   Hypertension Mother        lives in a 'rest home'   Stroke Mother    Hypertension Father    Seizures Sister    Other Sister        unknown medical history   Hypertension Sister    Cancer Brother    Prostate cancer Brother    Other Brother        MVA   Other Maternal Grandmother        unknown medical history   Other Maternal Grandfather        unknown medical history   Other Paternal Grandmother        unknown medical history   Other Paternal Grandfather        unknown medical history      Social History Social History   Tobacco Use   Smoking status: Former    Packs/day: .5    Types: Cigars, Cigarettes    Quit date: 07/2022    Years since quitting: 0.6    Passive exposure: Past   Smokeless  tobacco: Never   Tobacco comments:    Smokes 1 black and mild/day 05/08/21    09/02/22 Patient is homeless, goes to a shelter or couch surfing  Vaping Use   Vaping Use: Never used  Substance Use Topics   Alcohol use: No   Drug use: Not Currently    Types: "Crack" cocaine    Comment: last use ~2013        Review of Systems  Constitutional: Negative.   HENT: Negative.    Eyes: Negative.   Cardiovascular: Negative.   Gastrointestinal:  Positive for abdominal pain.  Genitourinary:  Positive for frequency.  Skin: Negative.   Neurological:  Positive for headaches.  Psychiatric/Behavioral:  Positive for depression.      Physical Exam Blood pressure (!) 143/85, pulse 66, temperature 98 F (36.7 C), height 6\' 2"  (1.88 m), weight 285 lb (129.3 kg), SpO2 98 %. Last Weight  Most recent update: 02/16/2023  2:25 PM    Weight   129.3 kg (285 lb)             CONSTITUTIONAL: Well developed, and nourished, appropriately responsive and aware without distress.   EYES: Sclera non-icteric.   EARS, NOSE, MOUTH AND THROAT:  The oropharynx is clear. Oral mucosa is pink and moist.    Hearing is intact to voice.  NECK: Trachea is midline, and there is no jugular venous distension.  LYMPH NODES:  Lymph nodes in the neck are not appreciated. RESPIRATORY:  Lungs are clear, and breath sounds are equal bilaterally.  Normal respiratory effort without pathologic use of accessory muscles. CARDIOVASCULAR: Heart is regular in rate and rhythm.   Well perfused.  GI: The abdomen is protuberant, supraumbilical midline epigastric scar, soft, nontender, and nondistended. There were no palpable masses.  Today he could not demonstrate the bulge, nor localizes it really well aside from left lower quadrant abdominal wall, not in the groin region.  I could not appreciate a fascial defect or focal area of tenderness.  And with a slight degree of frustration could not reproduce it at this time.  I did not appreciate hepatosplenomegaly.  GU: Bilateral cord lipomas without evidence of hernia sac.  Testes within large flaccid scrotal sac. MUSCULOSKELETAL:  Symmetrical muscle tone appreciated in all four extremities.    SKIN: Skin turgor is normal. No pathologic skin lesions appreciated.  NEUROLOGIC:  Motor and sensation appear grossly normal.  Cranial nerves are grossly without defect. PSYCH:  Alert and oriented to person, place and time. Affect is appropriate for situation.  Data Reviewed I have personally reviewed what is currently available of the patient's imaging, recent labs and medical records.   Labs:     Latest Ref Rng & Units 08/13/2022    8:58 AM 06/07/2021    9:40 AM 05/08/2021    6:59 AM  CBC  WBC 4.0 - 10.5 K/uL 8.0  7.4  7.9   Hemoglobin 13.0 - 17.0 g/dL 16.1  09.6  04.5   Hematocrit 39.0 - 52.0 % 44.9  39.4  43.0   Platelets 150 -  400 K/uL 311  325  289       Latest Ref Rng & Units 01/29/2023   10:38 AM 08/13/2022    8:58 AM 06/07/2021    9:40 AM  CMP  Glucose 70 - 99 mg/dL 409  811  914   BUN 8 - 27 mg/dL 10  13  12    Creatinine 0.76 - 1.27 mg/dL 7.82  9.56  2.13  Sodium 134 - 144 mmol/L 142  138  138   Potassium 3.5 - 5.2 mmol/L 4.3  4.4  4.0   Chloride 96 - 106 mmol/L 103  103  106   CO2 20 - 29 mmol/L 22  27  27    Calcium 8.6 - 10.2 mg/dL 9.7  9.5  9.1   Total Protein 6.0 - 8.5 g/dL 7.2  8.6    Total Bilirubin 0.0 - 1.2 mg/dL <1.6  0.7    Alkaline Phos 44 - 121 IU/L 94  77    AST 0 - 40 IU/L 17  33    ALT 0 - 44 IU/L 19  48        Imaging: Radiological images reviewed:   Within last 24 hrs: No results found.  Assessment    Left lower quadrant abdominal pain, history sounds consistent with recurrent ventral hernia.  Unable to fully appreciate fascial defect today.  Quite distressing and of significant pain/stability for the patient. Patient Active Problem List   Diagnosis Date Noted   Encounter for medical examination to establish care 01/29/2023   Bilateral leg edema 01/29/2023   Prediabetes 01/29/2023   Mixed hyperlipidemia 01/29/2023   Metabolic dysfunction-associated steatotic liver disease (MASLD) 01/29/2023   Essential hypertension 01/29/2023   Hernia of abdominal wall 01/29/2023   Encounter for hepatitis C screening test for low risk patient 01/29/2023   Encounter for screening for HIV 01/29/2023   Screening PSA (prostate specific antigen) 01/29/2023    Plan    I will need imaging if there is a partial recurrence of the hernia that may not be penetrating the anterior rectus fascia.  Abdominal/pelvic CT scan, follow-up after. He also of note has an elevated PSA and is being referred to urology.  He is quite concerned about this issue as well.  Face-to-face time spent with the patient and accompanying care providers(if present) was 35 minutes, with more than 50% of the time spent  counseling, educating, and coordinating care of the patient.    These notes generated with voice recognition software. I apologize for typographical errors.  Campbell Lerner M.D., FACS 02/16/2023, 3:23 PM

## 2023-02-16 ENCOUNTER — Ambulatory Visit (INDEPENDENT_AMBULATORY_CARE_PROVIDER_SITE_OTHER): Payer: Medicaid Other | Admitting: Surgery

## 2023-02-16 ENCOUNTER — Encounter: Payer: Self-pay | Admitting: Surgery

## 2023-02-16 VITALS — BP 143/85 | HR 66 | Temp 98.0°F | Ht 74.0 in | Wt 285.0 lb

## 2023-02-16 DIAGNOSIS — R1032 Left lower quadrant pain: Secondary | ICD-10-CM | POA: Diagnosis not present

## 2023-02-16 DIAGNOSIS — R972 Elevated prostate specific antigen [PSA]: Secondary | ICD-10-CM

## 2023-02-16 DIAGNOSIS — K439 Ventral hernia without obstruction or gangrene: Secondary | ICD-10-CM

## 2023-02-16 DIAGNOSIS — K432 Incisional hernia without obstruction or gangrene: Secondary | ICD-10-CM

## 2023-02-16 NOTE — Patient Instructions (Addendum)
We will get you scheduled for a CT scan of your abdomen with contrast to fully assess this area.   We will have you follow up here after we get your results.    You has been scheduled for a CT abdomen/pelvis with contrast at [facility] for May 21st. You will need to arrive at the [facility] entrance at 8:45 am. You will need to drink two cups of water before arriving.   See your follow up appointment below.

## 2023-02-23 ENCOUNTER — Ambulatory Visit: Payer: Medicaid Other

## 2023-02-25 ENCOUNTER — Telehealth: Payer: Self-pay

## 2023-02-25 ENCOUNTER — Ambulatory Visit: Payer: Medicaid Other | Admitting: Surgery

## 2023-02-25 NOTE — Telephone Encounter (Signed)
Patient is rescheduled for his CT scan on 03/04/23 at [facility]. He will arrive at the [facility] entrance at 4:45 pm and will need to drink 2 cups of water prior to arrival. He will follow up with Dr Claudine Mouton the following Tuesday on 03/09/23 at 11:15 am.

## 2023-03-04 ENCOUNTER — Ambulatory Visit
Admission: RE | Admit: 2023-03-04 | Discharge: 2023-03-04 | Disposition: A | Discharge status: Home / Self Care | Payer: Medicaid Other | Source: Ambulatory Visit | Attending: Surgery | Admitting: Surgery

## 2023-03-04 DIAGNOSIS — K760 Fatty (change of) liver, not elsewhere classified: Secondary | ICD-10-CM | POA: Diagnosis not present

## 2023-03-04 DIAGNOSIS — K439 Ventral hernia without obstruction or gangrene: Secondary | ICD-10-CM | POA: Diagnosis not present

## 2023-03-04 DIAGNOSIS — K5730 Diverticulosis of large intestine without perforation or abscess without bleeding: Secondary | ICD-10-CM | POA: Diagnosis not present

## 2023-03-04 DIAGNOSIS — K4090 Unilateral inguinal hernia, without obstruction or gangrene, not specified as recurrent: Secondary | ICD-10-CM | POA: Diagnosis not present

## 2023-03-04 MED ORDER — IOHEXOL 350 MG/ML SOLN
100.0000 mL | Freq: Once | INTRAVENOUS | Status: AC | PRN
  Administered 2023-03-04: 100 mL via INTRAVENOUS

## 2023-03-09 ENCOUNTER — Ambulatory Visit (INDEPENDENT_AMBULATORY_CARE_PROVIDER_SITE_OTHER): Payer: Medicaid Other | Admitting: Surgery

## 2023-03-09 ENCOUNTER — Encounter: Payer: Self-pay | Admitting: Surgery

## 2023-03-09 ENCOUNTER — Ambulatory Visit: Payer: Self-pay | Admitting: Surgery

## 2023-03-09 ENCOUNTER — Telehealth: Payer: Self-pay | Admitting: Surgery

## 2023-03-09 VITALS — BP 114/71 | HR 62 | Temp 98.0°F | Ht 74.0 in | Wt 279.0 lb

## 2023-03-09 DIAGNOSIS — K4090 Unilateral inguinal hernia, without obstruction or gangrene, not specified as recurrent: Secondary | ICD-10-CM | POA: Insufficient documentation

## 2023-03-09 DIAGNOSIS — K4020 Bilateral inguinal hernia, without obstruction or gangrene, not specified as recurrent: Secondary | ICD-10-CM

## 2023-03-09 NOTE — H&P (View-Only) (Signed)
Patient ID: Calvin Wheeler, male   DOB: 10/01/1961, 62 y.o.   MRN: [id number]  Chief Complaint: Left lower quadrant pain with bulge  History of Present Illness Follows up today to review CT scan imaging.  Essentially his symptoms are unchanged. As noted below.  Calvin Wheeler is a 62 y.o. male with intermittent presentation of left groin/lower abdominal bulge, typically presented while the patient is walking.  Excruciatingly painful requires laying in a recumbent position for reduction.  He has a prior history of umbilical hernia repair, uncertain if you utilized mesh at that time.  The pain seems to be localized at the periphery of that prior repair, and unfortunately today is unable to reproduce the bulge for me.  The pain has been quite debilitating, he describes a reduction maneuvering as the only way he can get relief.  Past Medical History Past Medical History:  Diagnosis Date   Diabetes (HCC)    Hypertension       Past Surgical History:  Procedure Laterality Date   COLONOSCOPY WITH PROPOFOL N/A 11/03/2018   Procedure: COLONOSCOPY WITH PROPOFOL;  Surgeon: Vanga, Rohini Reddy, MD;  Location: [facility] ENDOSCOPY;  Service: Gastroenterology;  Laterality: N/A;   COLONOSCOPY WITH PROPOFOL N/A 11/04/2018   Procedure: COLONOSCOPY WITH PROPOFOL;  Surgeon: Anna, Kiran, MD;  Location: [facility] ENDOSCOPY;  Service: Gastroenterology;  Laterality: N/A;   HERNIA REPAIR     MANDIBLE FRACTURE SURGERY     around the age of 30    Allergies  Allergen Reactions   Sulfa Antibiotics Other (See Comments)    Unsure    Current Outpatient Medications  Medication Sig Dispense Refill   albuterol (VENTOLIN HFA) 108 (90 Base) MCG/ACT inhaler Inhale 2 puffs into the lungs every 6 (six) hours as needed for wheezing or shortness of breath. 6.7 g 0   amLODipine (NORVASC) 5 MG tablet Take 1 tablet (5 mg total) by mouth daily. 100 tablet 3   atorvastatin (LIPITOR) 10 MG tablet Take 1 tablet (10 mg total) by mouth  daily. 90 tablet 2   citalopram (CELEXA) 20 MG tablet Take 1 tablet (20 mg total) by mouth daily. 100 tablet 3   fenofibrate (TRICOR) 145 MG tablet Take 1 tablet (145 mg total) by mouth daily. 30 tablet 2   metFORMIN (GLUCOPHAGE-XR) 500 MG 24 hr tablet Take 2 tablets (1,000 mg total) by mouth 2 (two) times daily with a meal. 120 tablet 2   No current facility-administered medications for this visit.    Family History Family History  Problem Relation Age of Onset   Hypertension Mother        lives in a 'rest home'   Stroke Mother    Hypertension Father    Seizures Sister    Other Sister        unknown medical history   Hypertension Sister    Cancer Brother    Prostate cancer Brother    Other Brother        MVA   Other Maternal Grandmother        unknown medical history   Other Maternal Grandfather        unknown medical history   Other Paternal Grandmother        unknown medical history   Other Paternal Grandfather        unknown medical history      Social History Social History   Tobacco Use   Smoking status: Former    Packs/day: .5    Types: Cigars,   Cigarettes    Quit date: 07/2022    Years since quitting: 0.6    Passive exposure: Past   Smokeless tobacco: Never   Tobacco comments:    Smokes 1 black and mild/day 05/08/21    09/02/22 Patient is homeless, goes to a shelter or couch surfing  Vaping Use   Vaping Use: Never used  Substance Use Topics   Alcohol use: No   Drug use: Not Currently    Types: "Crack" cocaine    Comment: last use ~2013        Review of Systems  Constitutional: Negative.   HENT: Negative.    Eyes: Negative.   Cardiovascular: Negative.   Gastrointestinal:  Positive for abdominal pain.  Genitourinary:  Positive for frequency.  Skin: Negative.   Neurological:  Positive for headaches.  Psychiatric/Behavioral:  Positive for depression.      Physical Exam Blood pressure 114/71, pulse 62, temperature 98 F (36.7 C), height 6' 2"  (1.88 m), weight 279 lb (126.6 kg), SpO2 97 %. Last Weight  Most recent update: 03/09/2023 10:47 AM    Weight  126.6 kg (279 lb)             CONSTITUTIONAL: Well developed, and nourished, appropriately responsive and aware without distress.   EYES: Sclera non-icteric.   EARS, NOSE, MOUTH AND THROAT:  The oropharynx is clear. Oral mucosa is pink and moist.    Hearing is intact to voice.  NECK: Trachea is midline, and there is no jugular venous distension.  LYMPH NODES:  Lymph nodes in the neck are not appreciated. RESPIRATORY:  Lungs are clear, and breath sounds are equal bilaterally.  Normal respiratory effort without pathologic use of accessory muscles. CARDIOVASCULAR: Heart is regular in rate and rhythm.   Well perfused.  GI: The abdomen is protuberant, supraumbilical midline epigastric scar, soft, nontender, and nondistended. There were no palpable masses.  Today he could not demonstrate the bulge, nor localizes it really well aside from left lower quadrant abdominal wall, not in the groin region.  I could not appreciate a fascial defect or focal area of tenderness.  And with a slight degree of frustration could not reproduce it at this time.  I did not appreciate hepatosplenomegaly.  GU: Bilateral cord lipomas without evidence of hernia sac.  Testes within large flaccid scrotal sac. MUSCULOSKELETAL:  Symmetrical muscle tone appreciated in all four extremities.    SKIN: Skin turgor is normal. No pathologic skin lesions appreciated.  NEUROLOGIC:  Motor and sensation appear grossly normal.  Cranial nerves are grossly without defect. PSYCH:  Alert and oriented to person, place and time. Affect is appropriate for situation.  Data Reviewed I have personally reviewed what is currently available of the patient's imaging, recent labs and medical records.   Labs:     Latest Ref Rng & Units 08/13/2022    8:58 AM 06/07/2021    9:40 AM 05/08/2021    6:59 AM  CBC  WBC 4.0 - 10.5 K/uL 8.0  7.4  7.9    Hemoglobin 13.0 - 17.0 g/dL 14.6  13.1  14.7   Hematocrit 39.0 - 52.0 % 44.9  39.4  43.0   Platelets 150 - 400 K/uL 311  325  289       Latest Ref Rng & Units 01/29/2023   10:38 AM 08/13/2022    8:58 AM 06/07/2021    9:40 AM  CMP  Glucose 70 - 99 mg/dL 103  111  127   BUN 8 -   27 mg/dL 10  13  12   Creatinine 0.76 - 1.27 mg/dL 0.97  1.03  1.13   Sodium 134 - 144 mmol/L 142  138  138   Potassium 3.5 - 5.2 mmol/L 4.3  4.4  4.0   Chloride 96 - 106 mmol/L 103  103  106   CO2 20 - 29 mmol/L 22  27  27   Calcium 8.6 - 10.2 mg/dL 9.7  9.5  9.1   Total Protein 6.0 - 8.5 g/dL 7.2  8.6    Total Bilirubin 0.0 - 1.2 mg/dL <0.2  0.7    Alkaline Phos 44 - 121 IU/L 94  77    AST 0 - 40 IU/L 17  33    ALT 0 - 44 IU/L 19  48        Imaging: Radiological images reviewed: Report not yet available.    Psych unit moments past close x 1 weeks okay some bleeding is a Within last 24 hrs: No results found.  Assessment    Bilateral inguinal hernias. Patient Active Problem List   Diagnosis Date Noted   Encounter for medical examination to establish care 01/29/2023   Bilateral leg edema 01/29/2023   Prediabetes 01/29/2023   Mixed hyperlipidemia 01/29/2023   Metabolic dysfunction-associated steatotic liver disease (MASLD) 01/29/2023   Essential hypertension 01/29/2023   Hernia of abdominal wall 01/29/2023   Encounter for hepatitis C screening test for low risk patient 01/29/2023   Encounter for screening for HIV 01/29/2023   Screening PSA (prostate specific antigen) 01/29/2023    Plan    Robotic bilateral inguinal hernia repair.  I discussed possibility of incarceration, strangulation, enlargement in size over time, and the need for emergency surgery in the face of these.  Also reviewed the techniques of reduction should incarceration occur, and when unsuccessful to present to the ED.  Also discussed that surgery risks include recurrence which can be up to 30% in the case of complex hernias,  use of prosthetic materials (mesh) and the increased risk of infection and the possible need for re-operation and removal of mesh, possibility of post-op SBO or ileus, and the risks of general anesthetic including heart attack, stroke, sudden death or some reaction to anesthetic medications. The patient, and those present, appear to understand the risks, any and all questions were answered to the patient's satisfaction.  No guarantees were ever expressed or implied.   Face-to-face time spent with the patient and accompanying care providers(if present) was 20 minutes, with more than 50% of the time spent counseling, educating, and coordinating care of the patient.    These notes generated with voice recognition software. I apologize for typographical errors.  Renelda Kilian M.D., FACS 03/09/2023, 11:00 AM     

## 2023-03-09 NOTE — Telephone Encounter (Signed)
Patient has been advised of Pre-Admission date/time, and Surgery date at [facility].  Surgery Date: 03/22/23 Preadmission Testing Date: 03/12/23 (phone 1p-4p)  Patient has been made aware to call [phone], between 1-3:00pm the day before surgery, to find out what time to arrive for surgery.

## 2023-03-09 NOTE — Progress Notes (Signed)
Patient ID: Calvin Wheeler, male   DOB: Apr 14, 1961, 61 y.o.   MRN: [id number]  Chief Complaint: Left lower quadrant pain with bulge  History of Present Illness Follows up today to review CT scan imaging.  Essentially his symptoms are unchanged. As noted below.  Calvin Wheeler is a 62 y.o. male with intermittent presentation of left groin/lower abdominal bulge, typically presented while the patient is walking.  Excruciatingly painful requires laying in a recumbent position for reduction.  He has a prior history of umbilical hernia repair, uncertain if you utilized mesh at that time.  The pain seems to be localized at the periphery of that prior repair, and unfortunately today is unable to reproduce the bulge for me.  The pain has been quite debilitating, he describes a reduction maneuvering as the only way he can get relief.  Past Medical History Past Medical History:  Diagnosis Date   Diabetes (HCC)    Hypertension       Past Surgical History:  Procedure Laterality Date   COLONOSCOPY WITH PROPOFOL N/A 11/03/2018   Procedure: COLONOSCOPY WITH PROPOFOL;  Surgeon: Toney Reil, MD;  Location: [facility] ENDOSCOPY;  Service: Gastroenterology;  Laterality: N/A;   COLONOSCOPY WITH PROPOFOL N/A 11/04/2018   Procedure: COLONOSCOPY WITH PROPOFOL;  Surgeon: Wyline Mood, MD;  Location: [facility] ENDOSCOPY;  Service: Gastroenterology;  Laterality: N/A;   HERNIA REPAIR     MANDIBLE FRACTURE SURGERY     around the age of 6    Allergies  Allergen Reactions   Sulfa Antibiotics Other (See Comments)    Unsure    Current Outpatient Medications  Medication Sig Dispense Refill   albuterol (VENTOLIN HFA) 108 (90 Base) MCG/ACT inhaler Inhale 2 puffs into the lungs every 6 (six) hours as needed for wheezing or shortness of breath. 6.7 g 0   amLODipine (NORVASC) 5 MG tablet Take 1 tablet (5 mg total) by mouth daily. 100 tablet 3   atorvastatin (LIPITOR) 10 MG tablet Take 1 tablet (10 mg total) by mouth  daily. 90 tablet 2   citalopram (CELEXA) 20 MG tablet Take 1 tablet (20 mg total) by mouth daily. 100 tablet 3   fenofibrate (TRICOR) 145 MG tablet Take 1 tablet (145 mg total) by mouth daily. 30 tablet 2   metFORMIN (GLUCOPHAGE-XR) 500 MG 24 hr tablet Take 2 tablets (1,000 mg total) by mouth 2 (two) times daily with a meal. 120 tablet 2   No current facility-administered medications for this visit.    Family History Family History  Problem Relation Age of Onset   Hypertension Mother        lives in a 'rest home'   Stroke Mother    Hypertension Father    Seizures Sister    Other Sister        unknown medical history   Hypertension Sister    Cancer Brother    Prostate cancer Brother    Other Brother        MVA   Other Maternal Grandmother        unknown medical history   Other Maternal Grandfather        unknown medical history   Other Paternal Grandmother        unknown medical history   Other Paternal Grandfather        unknown medical history      Social History Social History   Tobacco Use   Smoking status: Former    Packs/day: .5    Types: Cigars,  Cigarettes    Quit date: 07/2022    Years since quitting: 0.6    Passive exposure: Past   Smokeless tobacco: Never   Tobacco comments:    Smokes 1 black and mild/day 05/08/21    09/02/22 Patient is homeless, goes to a shelter or couch surfing  Vaping Use   Vaping Use: Never used  Substance Use Topics   Alcohol use: No   Drug use: Not Currently    Types: "Crack" cocaine    Comment: last use ~2013        Review of Systems  Constitutional: Negative.   HENT: Negative.    Eyes: Negative.   Cardiovascular: Negative.   Gastrointestinal:  Positive for abdominal pain.  Genitourinary:  Positive for frequency.  Skin: Negative.   Neurological:  Positive for headaches.  Psychiatric/Behavioral:  Positive for depression.      Physical Exam Blood pressure 114/71, pulse 62, temperature 98 F (36.7 C), height 6\' 2"   (1.88 m), weight 279 lb (126.6 kg), SpO2 97 %. Last Weight  Most recent update: 03/09/2023 10:47 AM    Weight  126.6 kg (279 lb)             CONSTITUTIONAL: Well developed, and nourished, appropriately responsive and aware without distress.   EYES: Sclera non-icteric.   EARS, NOSE, MOUTH AND THROAT:  The oropharynx is clear. Oral mucosa is pink and moist.    Hearing is intact to voice.  NECK: Trachea is midline, and there is no jugular venous distension.  LYMPH NODES:  Lymph nodes in the neck are not appreciated. RESPIRATORY:  Lungs are clear, and breath sounds are equal bilaterally.  Normal respiratory effort without pathologic use of accessory muscles. CARDIOVASCULAR: Heart is regular in rate and rhythm.   Well perfused.  GI: The abdomen is protuberant, supraumbilical midline epigastric scar, soft, nontender, and nondistended. There were no palpable masses.  Today he could not demonstrate the bulge, nor localizes it really well aside from left lower quadrant abdominal wall, not in the groin region.  I could not appreciate a fascial defect or focal area of tenderness.  And with a slight degree of frustration could not reproduce it at this time.  I did not appreciate hepatosplenomegaly.  GU: Bilateral cord lipomas without evidence of hernia sac.  Testes within large flaccid scrotal sac. MUSCULOSKELETAL:  Symmetrical muscle tone appreciated in all four extremities.    SKIN: Skin turgor is normal. No pathologic skin lesions appreciated.  NEUROLOGIC:  Motor and sensation appear grossly normal.  Cranial nerves are grossly without defect. PSYCH:  Alert and oriented to person, place and time. Affect is appropriate for situation.  Data Reviewed I have personally reviewed what is currently available of the patient's imaging, recent labs and medical records.   Labs:     Latest Ref Rng & Units 08/13/2022    8:58 AM 06/07/2021    9:40 AM 05/08/2021    6:59 AM  CBC  WBC 4.0 - 10.5 K/uL 8.0  7.4  7.9    Hemoglobin 13.0 - 17.0 g/dL 82.9  56.2  13.0   Hematocrit 39.0 - 52.0 % 44.9  39.4  43.0   Platelets 150 - 400 K/uL 311  325  289       Latest Ref Rng & Units 01/29/2023   10:38 AM 08/13/2022    8:58 AM 06/07/2021    9:40 AM  CMP  Glucose 70 - 99 mg/dL 865  784  696   BUN 8 -  27 mg/dL 10  13  12    Creatinine 0.76 - 1.27 mg/dL 0.98  1.19  1.47   Sodium 134 - 144 mmol/L 142  138  138   Potassium 3.5 - 5.2 mmol/L 4.3  4.4  4.0   Chloride 96 - 106 mmol/L 103  103  106   CO2 20 - 29 mmol/L 22  27  27    Calcium 8.6 - 10.2 mg/dL 9.7  9.5  9.1   Total Protein 6.0 - 8.5 g/dL 7.2  8.6    Total Bilirubin 0.0 - 1.2 mg/dL <8.2  0.7    Alkaline Phos 44 - 121 IU/L 94  77    AST 0 - 40 IU/L 17  33    ALT 0 - 44 IU/L 19  48        Imaging: Radiological images reviewed: Report not yet available.    Psych unit moments past close x 1 weeks okay some bleeding is a Within last 24 hrs: No results found.  Assessment    Bilateral inguinal hernias. Patient Active Problem List   Diagnosis Date Noted   Encounter for medical examination to establish care 01/29/2023   Bilateral leg edema 01/29/2023   Prediabetes 01/29/2023   Mixed hyperlipidemia 01/29/2023   Metabolic dysfunction-associated steatotic liver disease (MASLD) 01/29/2023   Essential hypertension 01/29/2023   Hernia of abdominal wall 01/29/2023   Encounter for hepatitis C screening test for low risk patient 01/29/2023   Encounter for screening for HIV 01/29/2023   Screening PSA (prostate specific antigen) 01/29/2023    Plan    Robotic bilateral inguinal hernia repair.  I discussed possibility of incarceration, strangulation, enlargement in size over time, and the need for emergency surgery in the face of these.  Also reviewed the techniques of reduction should incarceration occur, and when unsuccessful to present to the ED.  Also discussed that surgery risks include recurrence which can be up to 30% in the case of complex hernias,  use of prosthetic materials (mesh) and the increased risk of infection and the possible need for re-operation and removal of mesh, possibility of post-op SBO or ileus, and the risks of general anesthetic including heart attack, stroke, sudden death or some reaction to anesthetic medications. The patient, and those present, appear to understand the risks, any and all questions were answered to the patient's satisfaction.  No guarantees were ever expressed or implied.   Face-to-face time spent with the patient and accompanying care providers(if present) was 20 minutes, with more than 50% of the time spent counseling, educating, and coordinating care of the patient.    These notes generated with voice recognition software. I apologize for typographical errors.  Campbell Lerner M.D., FACS 03/09/2023, 11:00 AM

## 2023-03-09 NOTE — Patient Instructions (Signed)
You have chose to have your hernia repaired. This will be done by Dr. Rodenberg at [facility].  Please see your (blue) Pre-care information that you have been given today. Our surgery scheduler will call you to verify surgery date and to go over information.   You will need to arrange to be out of work for approximately 1-2 weeks and then you may return with a lifting restriction for 4 more weeks. If you have FMLA or Disability paperwork that needs to be filled out, please have your company fax your paperwork to [phone] or you may drop this by either office. This paperwork will be filled out within 3 days after your surgery has been completed.  You may have a bruise in your groin and also swelling and brusing in your testicle area. You may use ice 4-5 times daily for 15-20 minutes each time. Make sure that you place a barrier between you and the ice pack. To decrease the swelling, you may roll up a bath towel and place it vertically in between your thighs with your testicles resting on the towel. You will want to keep this area elevated as much as possible for several days following surgery.     Inguinal Hernia, Adult Muscles help keep everything in the body in its proper place. But if a weak spot in the muscles develops, something can poke through. That is called a hernia. When this happens in the lower part of the belly (abdomen), it is called an inguinal hernia. (It takes its name from a part of the body in this region called the inguinal canal.) A weak spot in the wall of muscles lets some fat or part of the small intestine bulge through. An inguinal hernia can develop at any age. Men get them more often than women. CAUSES  In adults, an inguinal hernia develops over time. It can be triggered by: Suddenly straining the muscles of the lower abdomen. Lifting heavy objects. Straining to have a bowel movement. Difficult bowel movements (constipation) can lead to this. Constant coughing. This may  be caused by smoking or lung disease. Being overweight. Being pregnant. Working at a job that requires long periods of standing or heavy lifting. Having had an inguinal hernia before. One type can be an emergency situation. It is called a strangulated inguinal hernia. It develops if part of the small intestine slips through the weak spot and cannot get back into the abdomen. The blood supply can be cut off. If that happens, part of the intestine may die. This situation requires emergency surgery. SYMPTOMS  Often, a small inguinal hernia has no symptoms. It is found when a healthcare provider does a physical exam. Larger hernias usually have symptoms.  In adults, symptoms may include: A lump in the groin. This is easier to see when the person is standing. It might disappear when lying down. In men, a lump in the scrotum. Pain or burning in the groin. This occurs especially when lifting, straining or coughing. A dull ache or feeling of pressure in the groin. Signs of a strangulated hernia can include: A bulge in the groin that becomes very painful and tender to the touch. A bulge that turns red or purple. Fever, nausea and vomiting. Inability to have a bowel movement or to pass gas. DIAGNOSIS  To decide if you have an inguinal hernia, a healthcare provider will probably do a physical examination. This will include asking questions about any symptoms you have noticed. The healthcare provider might   feel the groin area and ask you to cough. If an inguinal hernia is felt, the healthcare provider may try to slide it back into the abdomen. Usually no other tests are needed. TREATMENT  Treatments can vary. The size of the hernia makes a difference. Options include: Watchful waiting. This is often suggested if the hernia is small and you have had no symptoms. No medical procedure will be done unless symptoms develop. You will need to watch closely for symptoms. If any occur, contact your healthcare  provider right away. Surgery. This is used if the hernia is larger or you have symptoms. Open surgery. This is usually an outpatient procedure (you will not stay overnight in a hospital). An cut (incision) is made through the skin in the groin. The hernia is put back inside the abdomen. The weak area in the muscles is then repaired by herniorrhaphy or hernioplasty. Herniorrhaphy: in this type of surgery, the weak muscles are sewn back together. Hernioplasty: a patch or mesh is used to close the weak area in the abdominal wall. Laparoscopy. In this procedure, a surgeon makes small incisions. A thin tube with a tiny video camera (called a laparoscope) is put into the abdomen. The surgeon repairs the hernia with mesh by looking with the video camera and using two long instruments. HOME CARE INSTRUCTIONS  After surgery to repair an inguinal hernia: You will need to take pain medicine prescribed by your healthcare provider. Follow all directions carefully. You will need to take care of the wound from the incision. Your activity will be restricted for awhile. This will probably include no heavy lifting for several weeks. You also should not do anything too active for a few weeks. When you can return to work will depend on the type of job that you have. During "watchful waiting" periods, you should: Maintain a healthy weight. Eat a diet high in fiber (fruits, vegetables and whole grains). Drink plenty of fluids to avoid constipation. This means drinking enough water and other liquids to keep your urine clear or pale yellow. Do not lift heavy objects. Do not stand for long periods of time. Quit smoking. This should keep you from developing a frequent cough. SEEK MEDICAL CARE IF:  A bulge develops in your groin area. You feel pain, a burning sensation or pressure in the groin. This might be worse if you are lifting or straining. You develop a fever of more than 100.5 F (38.1 C). SEEK IMMEDIATE MEDICAL  CARE IF:  Pain in the groin increases suddenly. A bulge in the groin gets bigger suddenly and does not go down. For men, there is sudden pain in the scrotum. Or, the size of the scrotum increases. A bulge in the groin area becomes red or purple and is painful to touch. You have nausea or vomiting that does not go away. You feel your heart beating much faster than normal. You cannot have a bowel movement or pass gas. You develop a fever of more than 102.0 F (38.9 C).   This information is not intended to replace advice given to you by your health care provider. Make sure you discuss any questions you have with your health care provider.   Document Released: 02/07/2009 Document Revised: 12/14/2011 Document Reviewed: 03/25/2015 Elsevier Interactive Patient Education 2016 [employer] Inc[employer] 

## 2023-03-11 ENCOUNTER — Ambulatory Visit (INDEPENDENT_AMBULATORY_CARE_PROVIDER_SITE_OTHER): Payer: Medicaid Other | Admitting: Urology

## 2023-03-11 ENCOUNTER — Encounter: Payer: Self-pay | Admitting: Urology

## 2023-03-11 VITALS — BP 116/68 | HR 68 | Ht 74.0 in | Wt 280.0 lb

## 2023-03-11 DIAGNOSIS — R972 Elevated prostate specific antigen [PSA]: Secondary | ICD-10-CM | POA: Diagnosis not present

## 2023-03-11 NOTE — Progress Notes (Signed)
Marcelle Overlie Plume,acting as a scribe for Riki Altes, MD.,have documented all relevant documentation on the behalf of Riki Altes, MD,as directed by  Riki Altes, MD while in the presence of Riki Altes, MD.  03/11/2023 Wheeler:11 AM   Calvin Wheeler, Calvin Wheeler [id number]  Referring provider: Jacky Kindle, FNP [address] [place],  [place] [address]  Chief Complaint  Patient presents with   Elevated PSA    HPI: Calvin Wheeler is a 62 y.o. male who is referred for evaluation of an elevated PSA..   PSA drawn 01/29/2023 was elevated at 7.2 No previous PSA levels available for comparison Patient states that he does not know why he was referred to urology No bothersome LUTS Denies dysuria, gross hematuria Denies flank, abdominal or pelvic pain He has an older and younger brother who has been diagnosed with prostate cancer   PMH: Past Medical History:  Diagnosis Date   Diabetes (HCC)    Hypertension     Surgical History: Past Surgical History:  Procedure Laterality Date   COLONOSCOPY WITH PROPOFOL N/A 11/03/2018   Procedure: COLONOSCOPY WITH PROPOFOL;  Surgeon: Toney Reil, MD;  Location: [facility] ENDOSCOPY;  Service: Gastroenterology;  Laterality: N/A;   COLONOSCOPY WITH PROPOFOL N/A 11/04/2018   Procedure: COLONOSCOPY WITH PROPOFOL;  Surgeon: Wyline Mood, MD;  Location: [facility] ENDOSCOPY;  Service: Gastroenterology;  Laterality: N/A;   HERNIA REPAIR     MANDIBLE FRACTURE SURGERY     around the age of 62    Home Medications:  Allergies as of 03/11/2023       Reactions   Sulfa Antibiotics Other (See Comments)   Unsure        Medication List        Accurate as of March 11, 2023 Wheeler:11 AM. If you have any questions, ask your nurse or doctor.          albuterol 108 (90 Base) MCG/ACT inhaler Commonly known as: VENTOLIN HFA Inhale 2 puffs into the lungs every 6 (six) hours as needed for wheezing or shortness of breath.   amLODipine 5 MG  tablet Commonly known as: NORVASC Take 1 tablet (5 mg total) by mouth daily.   atorvastatin Wheeler MG tablet Commonly known as: LIPITOR Take 1 tablet (Wheeler mg total) by mouth daily.   citalopram 20 MG tablet Commonly known as: CELEXA Take 1 tablet (20 mg total) by mouth daily.   fenofibrate 145 MG tablet Commonly known as: Tricor Take 1 tablet (145 mg total) by mouth daily.   metFORMIN 500 MG 24 hr tablet Commonly known as: GLUCOPHAGE-XR Take 2 tablets (1,000 mg total) by mouth 2 (two) times daily with a meal.        Allergies:  Allergies  Allergen Reactions   Sulfa Antibiotics Other (See Comments)    Unsure    Family History: Family History  Problem Relation Age of Onset   Hypertension Mother        lives in a 'rest home'   Stroke Mother    Hypertension Father    Seizures Sister    Other Sister        unknown medical history   Hypertension Sister    Cancer Brother    Prostate cancer Brother    Other Brother        MVA   Other Maternal Grandmother        unknown medical history   Other Maternal Grandfather  unknown medical history   Other Paternal Grandmother        unknown medical history   Other Paternal Grandfather        unknown medical history    Social History:  reports that he quit smoking about 8 months ago. His smoking use included cigars and cigarettes. He smoked an average of .5 packs per day. He has been exposed to tobacco smoke. He has never used smokeless tobacco. He reports that he does not currently use drugs after having used the following drugs: "Crack" cocaine. He reports that he does not drink alcohol.   Physical Exam: BP 116/68   Pulse 68   Ht 6\' 2"  (1.88 m)   Wt 280 lb (127 kg)   BMI 35.95 kg/m   Constitutional:  Alert and oriented, No acute distress. HEENT: [place] AT Respiratory: Normal respiratory effort, no increased work of breathing. GU: Prostate 50 grams, smooth without nodules Psychiatric: Normal mood and  affect.  Laboratory Data:  Urinalysis Dipstick/microsopy negative  Assessment & Plan:    1. Elevated PSA Although PSA is a prostate cancer screening test he was informed that cancer is not the most common cause of an elevated PSA. Other potential causes including BPH and inflammation were discussed. He was informed that the only way to adequately diagnose prostate cancer would be a transrectal ultrasound and biopsy of the prostate. The procedure was discussed including potential risks of bleeding and infection/sepsis. He was also informed that a negative biopsy does not conclusively rule out the possibility that prostate cancer may be present and that continued monitoring is required. The use of newer adjunctive blood tests including PHI and 4kScore were discussed. The use of multiparametric prostate MRI to evaluate for lesions suspicious for high prostate cancer and aid in targeted biopsy was reviewed. Continued periodic surveillance was also discussed. Recc repeat PSA today. If it remains elevated, I recommend scheduling a prostate MRI  I have reviewed the above documentation for accuracy and completeness, and I agree with the above.   Riki Altes, MD  [facility] [address] [place], [place] [address] [phone]

## 2023-03-12 ENCOUNTER — Encounter
Admission: RE | Admit: 2023-03-12 | Discharge: 2023-03-12 | Disposition: A | Discharge status: Home / Self Care | Payer: Medicaid Other | Source: Ambulatory Visit | Attending: Surgery | Admitting: Surgery

## 2023-03-12 ENCOUNTER — Ambulatory Visit (INDEPENDENT_AMBULATORY_CARE_PROVIDER_SITE_OTHER): Payer: Medicaid Other | Admitting: Family Medicine

## 2023-03-12 ENCOUNTER — Encounter: Payer: Self-pay | Admitting: Family Medicine

## 2023-03-12 ENCOUNTER — Other Ambulatory Visit: Payer: Self-pay

## 2023-03-12 VITALS — BP 130/85 | HR 57 | Ht 74.0 in | Wt 282.0 lb

## 2023-03-12 DIAGNOSIS — F411 Generalized anxiety disorder: Secondary | ICD-10-CM | POA: Diagnosis not present

## 2023-03-12 DIAGNOSIS — E782 Mixed hyperlipidemia: Secondary | ICD-10-CM | POA: Diagnosis not present

## 2023-03-12 DIAGNOSIS — R972 Elevated prostate specific antigen [PSA]: Secondary | ICD-10-CM

## 2023-03-12 DIAGNOSIS — E119 Type 2 diabetes mellitus without complications: Secondary | ICD-10-CM

## 2023-03-12 DIAGNOSIS — Z59812 Housing instability, housed, homelessness in past 12 months: Secondary | ICD-10-CM

## 2023-03-12 DIAGNOSIS — Z01812 Encounter for preprocedural laboratory examination: Secondary | ICD-10-CM

## 2023-03-12 DIAGNOSIS — I10 Essential (primary) hypertension: Secondary | ICD-10-CM

## 2023-03-12 HISTORY — DX: Pneumonia, unspecified organism: J18.9

## 2023-03-12 HISTORY — DX: Unspecified osteoarthritis, unspecified site: M19.90

## 2023-03-12 HISTORY — DX: Depression, unspecified: F32.A

## 2023-03-12 HISTORY — DX: Anxiety disorder, unspecified: F41.9

## 2023-03-12 LAB — MICROSCOPIC EXAMINATION: Bacteria, UA: NONE SEEN

## 2023-03-12 LAB — URINALYSIS, COMPLETE
Bilirubin, UA: NEGATIVE
Glucose, UA: NEGATIVE
Ketones, UA: NEGATIVE
Leukocytes,UA: NEGATIVE
Nitrite, UA: NEGATIVE
RBC, UA: NEGATIVE
Specific Gravity, UA: 1.015 (ref 1.005–1.030)
Urobilinogen, Ur: 0.2 mg/dL (ref 0.2–1.0)
pH, UA: 8.5 — ABNORMAL HIGH (ref 5.0–7.5)

## 2023-03-12 LAB — PSA: Prostate Specific Ag, Serum: 6.5 ng/mL — ABNORMAL HIGH (ref 0.0–4.0)

## 2023-03-12 MED ORDER — CITALOPRAM HYDROBROMIDE 40 MG PO TABS: 40.0000 mg | ORAL_TABLET | Freq: Every day | ORAL | 3 refills | Status: AC

## 2023-03-12 MED ORDER — CLONAZEPAM 0.5 MG PO TABS: 0.2500 mg | ORAL_TABLET | Freq: Two times a day (BID) | ORAL | 5 refills | Status: AC | PRN

## 2023-03-12 MED ORDER — ROSUVASTATIN CALCIUM 20 MG PO TABS: 20.0000 mg | ORAL_TABLET | Freq: Every day | ORAL | 3 refills | Status: AC

## 2023-03-12 NOTE — Progress Notes (Unsigned)
Established patient visit   Patient: Calvin Wheeler   DOB: 22-Jan-1961   62 y.o. Male  MRN: [id number] Visit Date: 03/12/2023  Today's healthcare provider: Jacky Kindle, FNP  Re Introduced to nurse practitioner role and practice setting.  All questions answered.  Discussed provider/patient relationship and expectations.   Chief Complaint  Patient presents with   Follow-up    Anxiety and depression   Subjective    HPI HPI     Follow-up    Additional comments: Anxiety and depression      Last edited by Shelly Bombard, CMA on 03/12/2023 10:49 AM.         03/12/2023   10:44 AM  GAD 7 : Generalized Anxiety Score  Nervous, Anxious, on Edge 3  Control/stop worrying 3  Worry too much - different things 3  Trouble relaxing 3  Restless 1  Easily annoyed or irritable 3  Afraid - awful might happen 1  Total GAD 7 Score 17  Anxiety Difficulty Somewhat difficult       03/12/2023   10:43 AM 01/29/2023    9:54 AM 09/02/2022   10:01 AM  PHQ9 SCORE ONLY  PHQ-9 Total Score 9 11 0   The 10-year ASCVD risk score (Arnett DK, et al., 2019) is: 28%  Medications: Outpatient Medications Prior to Visit  Medication Sig   albuterol (VENTOLIN HFA) 108 (90 Base) MCG/ACT inhaler Inhale 2 puffs into the lungs every 6 (six) hours as needed for wheezing or shortness of breath.   amLODipine (NORVASC) 5 MG tablet Take 1 tablet (5 mg total) by mouth daily.   fenofibrate (TRICOR) 145 MG tablet Take 1 tablet (145 mg total) by mouth daily.   metFORMIN (GLUCOPHAGE-XR) 500 MG 24 hr tablet Take 2 tablets (1,000 mg total) by mouth 2 (two) times daily with a meal.   [DISCONTINUED] citalopram (CELEXA) 20 MG tablet Take 1 tablet (20 mg total) by mouth daily.   [DISCONTINUED] atorvastatin (LIPITOR) 10 MG tablet Take 1 tablet (10 mg total) by mouth daily. (Patient not taking: Reported on 03/12/2023)   No facility-administered medications prior to visit.    Review of Systems    Objective    BP 130/85  (BP Location: Left Arm, Patient Position: Sitting, Cuff Size: Large)   Pulse (!) 57   Ht 6\' 2"  (1.88 m)   Wt 282 lb (127.9 kg)   SpO2 97%   BMI 36.21 kg/m   Physical Exam Vitals and nursing note reviewed.  Constitutional:      Appearance: Normal appearance. He is obese.  HENT:     Head: Normocephalic and atraumatic.  Cardiovascular:     Rate and Rhythm: Normal rate and regular rhythm.     Pulses: Normal pulses.     Heart sounds: Normal heart sounds.  Pulmonary:     Effort: Pulmonary effort is normal.     Breath sounds: Normal breath sounds.  Musculoskeletal:        General: Normal range of motion.     Cervical back: Normal range of motion.  Skin:    General: Skin is warm and dry.     Capillary Refill: Capillary refill takes less than 2 seconds.  Neurological:     General: No focal deficit present.     Mental Status: He is alert and oriented to person, place, and time. Mental status is at baseline.  Psychiatric:        Mood and Affect: Mood is anxious.  Behavior: Behavior normal.        Thought Content: Thought content normal.        Judgment: Judgment normal.      No results found for any visits on 03/12/23.  Assessment & Plan     Problem List Items Addressed This Visit       Cardiovascular and Mediastinum   Essential hypertension    Chronic, borderline Continues on norvasc 5 mg; consider titration vs addition of ACE/ARB to assist with known pre-diabetes      Relevant Medications   rosuvastatin (CRESTOR) 20 MG tablet     Other   GAD (generalized anxiety disorder) - Primary    Chronic, ongoing Will increase celexa from 20 mg to 40 mg with addition of klonopin to assist with ongoing treatment of BPH elevation and concern for Prostate CA      Relevant Medications   citalopram (CELEXA) 40 MG tablet   Housing instability after recent homelessness    Referral to CCM to assist given previous homelessness and ongoing concerns for cost of medication and  f/u      Relevant Orders   AMB Referral to Managed Medicaid Care Management   Mixed hyperlipidemia    Chronic, elevated ASCVD risk Refill Crestor 20 mg to assist healthy diet and purposeful exercise      Relevant Medications   rosuvastatin (CRESTOR) 20 MG tablet   Return in about 8 weeks (around 05/07/2023) for anxiety and depression.     Leilani Merl, FNP, have reviewed all documentation for this visit. The documentation on 03/14/23 for the exam, diagnosis, procedures, and orders are all accurate and complete.  Jacky Kindle, FNP  [facility] [phone] (phone) [phone] (fax)  [facility]

## 2023-03-12 NOTE — Patient Instructions (Addendum)
Your procedure is scheduled on: 03/22/23 - Monday Report to the Registration Desk on the 1st floor of the [facility]. To find out your arrival time, please call [phone] between 1PM - 3PM on: 03/19/23 - Friday If your arrival time is 6:00 am, do not arrive before that time as the [facility] entrance doors do not open until 6:00 am.  REMEMBER: Instructions that are not followed completely may result in serious medical risk, up to and including death; or upon the discretion of your surgeon and anesthesiologist your surgery may need to be rescheduled.  Do not eat food or drink any liquids after midnight the night before surgery.  No gum chewing or hard candies   One week prior to surgery: Stop Anti-inflammatories (NSAIDS) such as Advil, Aleve, Ibuprofen, Motrin, Naproxen, Naprosyn and Aspirin based products such as Excedrin, Goody's Powder, BC Powder.  Stop ANY OVER THE COUNTER supplements until after surgery.  You may however, continue to take Tylenol if needed for pain up until the day of surgery.  Continue taking all prescribed medications with the exception of the following:  metFORMIN (GLUCOPHAGE-XR) stop on 03/20/23.  TAKE ONLY THESE MEDICATIONS THE MORNING OF SURGERY WITH A SIP OF WATER:  amLODipine (NORVASC)  citalopram (CELEXA)    Use inhaler albuterol (VENTOLIN HFA)  on the day of surgery and bring to the hospital.   No Alcohol for 24 hours before or after surgery.  No Smoking including e-cigarettes for 24 hours before surgery.  No chewable tobacco products for at least 6 hours before surgery.  No nicotine patches on the day of surgery.  Do not use any "recreational" drugs for at least a week (preferably 2 weeks) before your surgery.  Please be advised that the combination of cocaine and anesthesia may have negative outcomes, up to and including death. If you test positive for cocaine, your surgery will be cancelled.  On the morning of surgery brush your  teeth with toothpaste and water, you may rinse your mouth with mouthwash if you wish. Do not swallow any toothpaste or mouthwash.  Do not wear jewelry, make-up, hairpins, clips or nail polish.  Do not wear lotions, powders, or perfumes.   Do not shave body hair from the neck down 48 hours before surgery.  Contact lenses, hearing aids and dentures may not be worn into surgery.  Do not bring valuables to the hospital. [facility] is not responsible for any missing/lost belongings or valuables.   Notify your doctor if there is any change in your medical condition (cold, fever, infection).  Wear comfortable clothing (specific to your surgery type) to the hospital.  After surgery, you can help prevent lung complications by doing breathing exercises.  Take deep breaths and cough every 1-2 hours. Your doctor may order a device called an Incentive Spirometer to help you take deep breaths. When coughing or sneezing, hold a pillow firmly against your incision with both hands. This is called "splinting." Doing this helps protect your incision. It also decreases belly discomfort.  If you are being admitted to the hospital overnight, leave your suitcase in the car. After surgery it may be brought to your room.  In case of increased patient census, it may be necessary for you, the patient, to continue your postoperative care in the Same Day Surgery department.  If you are being discharged the day of surgery, you will not be allowed to drive home. You will need a responsible individual to drive you home and stay  with you for 24 hours after surgery.   If you are taking public transportation, you will need to have a responsible individual with you.  Please call the Pre-admissions Testing Dept. at [phone] if you have any questions about these instructions.  Surgery Visitation Policy:  Patients having surgery or a procedure may have two visitors.  Children under the age of 71 must have an adult  with them who is not the patient.  Inpatient Visitation:    Visiting hours are 7 a.m. to 8 p.m. Up to four visitors are allowed at one time in a patient room. The visitors may rotate out with other people during the day.  One visitor age 23 or older may stay with the patient overnight and must be in the room by 8 p.m.

## 2023-03-14 DIAGNOSIS — Z59812 Housing instability, housed, homelessness in past 12 months: Secondary | ICD-10-CM | POA: Insufficient documentation

## 2023-03-14 DIAGNOSIS — F411 Generalized anxiety disorder: Secondary | ICD-10-CM | POA: Insufficient documentation

## 2023-03-14 NOTE — Assessment & Plan Note (Signed)
Chronic, ongoing Will increase celexa from 20 mg to 40 mg with addition of klonopin to assist with ongoing treatment of BPH elevation and concern for Prostate CA

## 2023-03-14 NOTE — Assessment & Plan Note (Signed)
Chronic, elevated ASCVD risk Refill Crestor 20 mg to assist healthy diet and purposeful exercise

## 2023-03-14 NOTE — Assessment & Plan Note (Signed)
Referral to CCM to assist given previous homelessness and ongoing concerns for cost of medication and f/u

## 2023-03-14 NOTE — Assessment & Plan Note (Signed)
Chronic, borderline Continues on norvasc 5 mg; consider titration vs addition of ACE/ARB to assist with known pre-diabetes

## 2023-03-18 ENCOUNTER — Encounter
Admission: RE | Admit: 2023-03-18 | Discharge: 2023-03-18 | Disposition: A | Discharge status: Home / Self Care | Payer: Medicaid Other | Source: Ambulatory Visit | Attending: Surgery | Admitting: Surgery

## 2023-03-18 DIAGNOSIS — Z01818 Encounter for other preprocedural examination: Secondary | ICD-10-CM | POA: Insufficient documentation

## 2023-03-18 DIAGNOSIS — E119 Type 2 diabetes mellitus without complications: Secondary | ICD-10-CM | POA: Insufficient documentation

## 2023-03-18 DIAGNOSIS — Z01812 Encounter for preprocedural laboratory examination: Secondary | ICD-10-CM

## 2023-03-18 DIAGNOSIS — I10 Essential (primary) hypertension: Secondary | ICD-10-CM | POA: Diagnosis not present

## 2023-03-18 LAB — CBC
HCT: 41.4 % (ref 39.0–52.0)
Hemoglobin: 13.3 g/dL (ref 13.0–17.0)
MCH: 28.5 pg (ref 26.0–34.0)
MCHC: 32.1 g/dL (ref 30.0–36.0)
MCV: 88.7 fL (ref 80.0–100.0)
Platelets: 328 10*3/uL (ref 150–400)
RBC: 4.67 MIL/uL (ref 4.22–5.81)
RDW: 15.4 % (ref 11.5–15.5)
WBC: 8.9 10*3/uL (ref 4.0–10.5)
nRBC: 0 % (ref 0.0–0.2)

## 2023-03-21 MED ORDER — CHLORHEXIDINE GLUCONATE CLOTH 2 % EX PADS
6.0000 | MEDICATED_PAD | Freq: Once | CUTANEOUS | Status: AC
  Administered 2023-03-22: 6 via TOPICAL

## 2023-03-21 MED ORDER — GABAPENTIN 300 MG PO CAPS
300.0000 mg | ORAL_CAPSULE | ORAL | Status: AC
  Administered 2023-03-22: 300 mg via ORAL

## 2023-03-21 MED ORDER — CHLORHEXIDINE GLUCONATE 0.12 % MT SOLN
15.0000 mL | Freq: Once | OROMUCOSAL | Status: AC
  Administered 2023-03-22: 15 mL via OROMUCOSAL

## 2023-03-21 MED ORDER — FAMOTIDINE 20 MG PO TABS
20.0000 mg | ORAL_TABLET | Freq: Once | ORAL | Status: AC
  Administered 2023-03-22: 20 mg via ORAL

## 2023-03-21 MED ORDER — SODIUM CHLORIDE 0.9 % IV SOLN: INTRAVENOUS | Status: DC

## 2023-03-21 MED ORDER — ACETAMINOPHEN 500 MG PO TABS
1000.0000 mg | ORAL_TABLET | ORAL | Status: AC
  Administered 2023-03-22: 1000 mg via ORAL

## 2023-03-21 MED ORDER — ORAL CARE MOUTH RINSE: 15.0000 mL | Freq: Once | OROMUCOSAL | Status: AC

## 2023-03-21 MED ORDER — CHLORHEXIDINE GLUCONATE CLOTH 2 % EX PADS: 6.0000 | MEDICATED_PAD | Freq: Once | CUTANEOUS | Status: DC

## 2023-03-21 MED ORDER — CEFAZOLIN SODIUM-DEXTROSE 2-4 GM/100ML-% IV SOLN
2.0000 g | INTRAVENOUS | Status: AC
  Administered 2023-03-22: 3 g via INTRAVENOUS

## 2023-03-21 MED ORDER — BUPIVACAINE LIPOSOME 1.3 % IJ SUSP: 20.0000 mL | Freq: Once | INTRAMUSCULAR | Status: DC

## 2023-03-22 ENCOUNTER — Ambulatory Visit: Payer: Medicaid Other

## 2023-03-22 ENCOUNTER — Other Ambulatory Visit: Payer: Self-pay

## 2023-03-22 ENCOUNTER — Encounter
Admission: RE | Disposition: A | Discharge status: Home / Self Care | Payer: Self-pay | Source: Home / Self Care | Attending: Surgery

## 2023-03-22 ENCOUNTER — Ambulatory Visit
Admission: RE | Admit: 2023-03-22 | Discharge: 2023-03-22 | Disposition: A | Discharge status: Home / Self Care | Payer: Medicaid Other | Attending: Surgery | Admitting: Surgery

## 2023-03-22 ENCOUNTER — Encounter: Payer: Self-pay | Admitting: Surgery

## 2023-03-22 ENCOUNTER — Ambulatory Visit: Payer: Medicaid Other | Admitting: Urgent Care

## 2023-03-22 DIAGNOSIS — I10 Essential (primary) hypertension: Secondary | ICD-10-CM | POA: Diagnosis not present

## 2023-03-22 DIAGNOSIS — Z09 Encounter for follow-up examination after completed treatment for conditions other than malignant neoplasm: Secondary | ICD-10-CM | POA: Insufficient documentation

## 2023-03-22 DIAGNOSIS — K4090 Unilateral inguinal hernia, without obstruction or gangrene, not specified as recurrent: Secondary | ICD-10-CM | POA: Diagnosis not present

## 2023-03-22 DIAGNOSIS — K4020 Bilateral inguinal hernia, without obstruction or gangrene, not specified as recurrent: Secondary | ICD-10-CM | POA: Diagnosis not present

## 2023-03-22 DIAGNOSIS — E119 Type 2 diabetes mellitus without complications: Secondary | ICD-10-CM | POA: Insufficient documentation

## 2023-03-22 DIAGNOSIS — F32A Depression, unspecified: Secondary | ICD-10-CM | POA: Diagnosis not present

## 2023-03-22 DIAGNOSIS — Z79899 Other long term (current) drug therapy: Secondary | ICD-10-CM | POA: Diagnosis not present

## 2023-03-22 DIAGNOSIS — Z87891 Personal history of nicotine dependence: Secondary | ICD-10-CM | POA: Insufficient documentation

## 2023-03-22 DIAGNOSIS — Z7984 Long term (current) use of oral hypoglycemic drugs: Secondary | ICD-10-CM | POA: Diagnosis not present

## 2023-03-22 DIAGNOSIS — M1990 Unspecified osteoarthritis, unspecified site: Secondary | ICD-10-CM | POA: Diagnosis not present

## 2023-03-22 DIAGNOSIS — F419 Anxiety disorder, unspecified: Secondary | ICD-10-CM | POA: Diagnosis not present

## 2023-03-22 DIAGNOSIS — Z01812 Encounter for preprocedural laboratory examination: Secondary | ICD-10-CM

## 2023-03-22 HISTORY — PX: INSERTION OF MESH: SHX5868

## 2023-03-22 LAB — GLUCOSE, CAPILLARY
Glucose-Capillary: 95 mg/dL (ref 70–99)
Glucose-Capillary: 127 mg/dL — ABNORMAL HIGH (ref 70–99)

## 2023-03-22 SURGERY — REPAIR, HERNIA, INGUINAL, BILATERAL, ROBOT-ASSISTED
Anesthesia: General | Laterality: Left

## 2023-03-22 MED ORDER — DEXAMETHASONE SODIUM PHOSPHATE 10 MG/ML IJ SOLN
INTRAMUSCULAR | Status: DC | PRN
  Administered 2023-03-22: 5 mg via INTRAVENOUS

## 2023-03-22 MED ORDER — PROPOFOL 10 MG/ML IV BOLUS
INTRAVENOUS | Status: AC
  Filled 2023-03-22: qty 40

## 2023-03-22 MED ORDER — FENTANYL CITRATE (PF) 100 MCG/2ML IJ SOLN
INTRAMUSCULAR | Status: AC
  Filled 2023-03-22: qty 2

## 2023-03-22 MED ORDER — MIDAZOLAM HCL 2 MG/2ML IJ SOLN
INTRAMUSCULAR | Status: DC | PRN
  Administered 2023-03-22: 2 mg via INTRAVENOUS

## 2023-03-22 MED ORDER — DEXAMETHASONE SODIUM PHOSPHATE 10 MG/ML IJ SOLN
INTRAMUSCULAR | Status: AC
  Filled 2023-03-22: qty 1

## 2023-03-22 MED ORDER — FAMOTIDINE 20 MG PO TABS
ORAL_TABLET | ORAL | Status: AC
  Filled 2023-03-22: qty 1

## 2023-03-22 MED ORDER — GABAPENTIN 300 MG PO CAPS
ORAL_CAPSULE | ORAL | Status: AC
  Filled 2023-03-22: qty 1

## 2023-03-22 MED ORDER — ONDANSETRON HCL 4 MG/2ML IJ SOLN
INTRAMUSCULAR | Status: DC | PRN
  Administered 2023-03-22: 4 mg via INTRAVENOUS

## 2023-03-22 MED ORDER — PROPOFOL 10 MG/ML IV BOLUS
INTRAVENOUS | Status: DC | PRN
  Administered 2023-03-22: 170 mg via INTRAVENOUS

## 2023-03-22 MED ORDER — ONDANSETRON HCL 4 MG/2ML IJ SOLN
INTRAMUSCULAR | Status: AC
  Filled 2023-03-22: qty 2

## 2023-03-22 MED ORDER — FENTANYL CITRATE (PF) 100 MCG/2ML IJ SOLN
INTRAMUSCULAR | Status: DC | PRN
  Administered 2023-03-22: 50 ug via INTRAVENOUS
  Administered 2023-03-22: 100 ug via INTRAVENOUS
  Administered 2023-03-22: 50 ug via INTRAVENOUS

## 2023-03-22 MED ORDER — BUPIVACAINE-EPINEPHRINE (PF) 0.25% -1:200000 IJ SOLN
INTRAMUSCULAR | Status: DC | PRN
  Administered 2023-03-22: 30 mL

## 2023-03-22 MED ORDER — SUGAMMADEX SODIUM 200 MG/2ML IV SOLN
INTRAVENOUS | Status: DC | PRN
  Administered 2023-03-22: 50 mg via INTRAVENOUS
  Administered 2023-03-22: 300 mg via INTRAVENOUS
  Administered 2023-03-22: 100 mg via INTRAVENOUS

## 2023-03-22 MED ORDER — HYDROCODONE-ACETAMINOPHEN 5-325 MG PO TABS
1.0000 | ORAL_TABLET | Freq: Four times a day (QID) | ORAL | 0 refills | Status: AC | PRN
Start: 2023-03-22 — End: ?

## 2023-03-22 MED ORDER — ACETAMINOPHEN 500 MG PO TABS
ORAL_TABLET | ORAL | Status: AC
  Filled 2023-03-22: qty 2

## 2023-03-22 MED ORDER — OXYCODONE HCL 5 MG/5ML PO SOLN: 5.0000 mg | Freq: Once | ORAL | Status: DC | PRN

## 2023-03-22 MED ORDER — ROCURONIUM BROMIDE 10 MG/ML (PF) SYRINGE
PREFILLED_SYRINGE | INTRAVENOUS | Status: AC
  Filled 2023-03-22: qty 10

## 2023-03-22 MED ORDER — MIDAZOLAM HCL 2 MG/2ML IJ SOLN
INTRAMUSCULAR | Status: AC
  Filled 2023-03-22: qty 2

## 2023-03-22 MED ORDER — CEFAZOLIN SODIUM-DEXTROSE 2-4 GM/100ML-% IV SOLN
INTRAVENOUS | Status: AC
  Filled 2023-03-22: qty 100

## 2023-03-22 MED ORDER — DROPERIDOL 2.5 MG/ML IJ SOLN: 0.6250 mg | Freq: Once | INTRAMUSCULAR | Status: DC | PRN

## 2023-03-22 MED ORDER — ALBUTEROL SULFATE HFA 108 (90 BASE) MCG/ACT IN AERS
INHALATION_SPRAY | RESPIRATORY_TRACT | Status: AC
  Filled 2023-03-22: qty 6.7

## 2023-03-22 MED ORDER — HYDRALAZINE HCL 20 MG/ML IJ SOLN
INTRAMUSCULAR | Status: DC | PRN
  Administered 2023-03-22: 5 mg via INTRAVENOUS

## 2023-03-22 MED ORDER — ALBUTEROL SULFATE HFA 108 (90 BASE) MCG/ACT IN AERS
INHALATION_SPRAY | RESPIRATORY_TRACT | Status: DC | PRN
  Administered 2023-03-22: 10 via RESPIRATORY_TRACT

## 2023-03-22 MED ORDER — EPINEPHRINE PF 1 MG/ML IJ SOLN
INTRAMUSCULAR | Status: AC
  Filled 2023-03-22: qty 1

## 2023-03-22 MED ORDER — CHLORHEXIDINE GLUCONATE 0.12 % MT SOLN
OROMUCOSAL | Status: AC
  Filled 2023-03-22: qty 15

## 2023-03-22 MED ORDER — LIDOCAINE HCL (CARDIAC) PF 100 MG/5ML IV SOSY
PREFILLED_SYRINGE | INTRAVENOUS | Status: DC | PRN
  Administered 2023-03-22: 80 mg via INTRAVENOUS

## 2023-03-22 MED ORDER — PROMETHAZINE HCL 25 MG/ML IJ SOLN: 6.2500 mg | INTRAMUSCULAR | Status: DC | PRN

## 2023-03-22 MED ORDER — FENTANYL CITRATE (PF) 100 MCG/2ML IJ SOLN: 25.0000 ug | INTRAMUSCULAR | Status: DC | PRN

## 2023-03-22 MED ORDER — HYDRALAZINE HCL 20 MG/ML IJ SOLN
INTRAMUSCULAR | Status: AC
  Filled 2023-03-22: qty 1

## 2023-03-22 MED ORDER — CEFAZOLIN SODIUM 1 G IJ SOLR
INTRAMUSCULAR | Status: AC
  Filled 2023-03-22: qty 10

## 2023-03-22 MED ORDER — BUPIVACAINE HCL (PF) 0.25 % IJ SOLN
INTRAMUSCULAR | Status: AC
  Filled 2023-03-22: qty 30

## 2023-03-22 MED ORDER — IBUPROFEN 800 MG PO TABS: 800.0000 mg | ORAL_TABLET | Freq: Three times a day (TID) | ORAL | 0 refills | Status: AC | PRN

## 2023-03-22 MED ORDER — OXYCODONE HCL 5 MG PO TABS: 5.0000 mg | ORAL_TABLET | Freq: Once | ORAL | Status: DC | PRN

## 2023-03-22 MED ORDER — ROCURONIUM BROMIDE 100 MG/10ML IV SOLN
INTRAVENOUS | Status: DC | PRN
  Administered 2023-03-22: 20 mg via INTRAVENOUS
  Administered 2023-03-22: 80 mg via INTRAVENOUS

## 2023-03-22 MED ORDER — ACETAMINOPHEN 10 MG/ML IV SOLN: 1000.0000 mg | Freq: Once | INTRAVENOUS | Status: DC | PRN

## 2023-03-22 MED ORDER — LIDOCAINE HCL (PF) 2 % IJ SOLN
INTRAMUSCULAR | Status: AC
  Filled 2023-03-22: qty 5

## 2023-03-22 SURGICAL SUPPLY — 50 items
ADH SKN CLS APL DERMABOND .7 (GAUZE/BANDAGES/DRESSINGS) ×2
BLADE CLIPPER SURG (BLADE) ×2 IMPLANT
COVER TIP SHEARS 8 DVNC (MISCELLANEOUS) ×2 IMPLANT
COVER WAND RF STERILE (DRAPES) ×2 IMPLANT
DERMABOND ADVANCED .7 DNX12 (GAUZE/BANDAGES/DRESSINGS) ×2 IMPLANT
DRAPE ARM DVNC X/XI (DISPOSABLE) ×6 IMPLANT
DRAPE COLUMN DVNC XI (DISPOSABLE) ×2 IMPLANT
DRAPE UTILITY 15X26 TOWEL STRL (DRAPES) ×2 IMPLANT
ELECT REM PT RETURN 9FT ADLT (ELECTROSURGICAL) ×2
ELECTRODE REM PT RTRN 9FT ADLT (ELECTROSURGICAL) ×2 IMPLANT
FORCEPS BPLR R/ABLATION 8 DVNC (INSTRUMENTS) ×2 IMPLANT
GLOVE ORTHO TXT STRL SZ7.5 (GLOVE) ×6 IMPLANT
GOWN STRL REUS W/ TWL LRG LVL3 (GOWN DISPOSABLE) ×2 IMPLANT
GOWN STRL REUS W/ TWL XL LVL3 (GOWN DISPOSABLE) ×4 IMPLANT
GOWN STRL REUS W/TWL LRG LVL3 (GOWN DISPOSABLE) ×2
GOWN STRL REUS W/TWL XL LVL3 (GOWN DISPOSABLE) ×4
GRASPER SUT TROCAR 14GX15 (MISCELLANEOUS) IMPLANT
IRRIGATION STRYKERFLOW (MISCELLANEOUS) IMPLANT
IRRIGATOR STRYKERFLOW (MISCELLANEOUS)
IV NS 1000ML (IV SOLUTION)
IV NS 1000ML BAXH (IV SOLUTION) IMPLANT
KIT PINK PAD W/HEAD ARE REST (MISCELLANEOUS) ×2
KIT PINK PAD W/HEAD ARM REST (MISCELLANEOUS) ×2 IMPLANT
LABEL OR SOLS (LABEL) ×2 IMPLANT
MANIFOLD NEPTUNE II (INSTRUMENTS) ×2 IMPLANT
MESH 3DMAX LIGHT 4.8X6.7 LT XL (Mesh General) IMPLANT
NDL DRIVE SUT CUT DVNC (INSTRUMENTS) ×2 IMPLANT
NDL HYPO 22X1.5 SAFETY MO (MISCELLANEOUS) ×2 IMPLANT
NDL INSUFFLATION 14GA 120MM (NEEDLE) IMPLANT
NEEDLE DRIVE SUT CUT DVNC (INSTRUMENTS) ×2 IMPLANT
NEEDLE HYPO 22X1.5 SAFETY MO (MISCELLANEOUS) ×2 IMPLANT
NEEDLE INSUFFLATION 14GA 120MM (NEEDLE) IMPLANT
PACK LAP CHOLECYSTECTOMY (MISCELLANEOUS) ×2 IMPLANT
SCISSORS MNPLR CVD DVNC XI (INSTRUMENTS) ×2 IMPLANT
SEAL UNIV 5-12 XI (MISCELLANEOUS) ×6 IMPLANT
SET TUBE SMOKE EVAC HIGH FLOW (TUBING) ×2 IMPLANT
SOL ELECTROSURG ANTI STICK (MISCELLANEOUS) ×2
SOLUTION ELECTROSURG ANTI STCK (MISCELLANEOUS) ×2 IMPLANT
SUT MNCRL 4-0 (SUTURE) ×2
SUT MNCRL 4-0 27XMFL (SUTURE) ×2
SUT V-LOC 90 ABS 3-0 VLT V-20 (SUTURE) IMPLANT
SUT VIC AB 2-0 SH 27 (SUTURE) ×2
SUT VIC AB 2-0 SH 27XBRD (SUTURE) ×2 IMPLANT
SUT VIC AB 3-0 SH 27 (SUTURE)
SUT VIC AB 3-0 SH 27X BRD (SUTURE) IMPLANT
SUT VICRYL 0 UR6 27IN ABS (SUTURE) IMPLANT
SUT VLOC 90 S/L VL9 GS22 (SUTURE) IMPLANT
SUTURE MNCRL 4-0 27XMF (SUTURE) ×2 IMPLANT
TRAP FLUID SMOKE EVACUATOR (MISCELLANEOUS) ×2 IMPLANT
WATER STERILE IRR 500ML POUR (IV SOLUTION) ×2 IMPLANT

## 2023-03-22 NOTE — Transfer of Care (Signed)
Immediate Anesthesia Transfer of Care Note  Patient: Calvin Wheeler  Procedure(s) Performed: XI ROBOTIC ASSISTED LEFT INGUINAL HERNIA (Left) INSERTION OF MESH  Patient Location: PACU  Anesthesia Type:General  Level of Consciousness: awake, drowsy, patient cooperative, and responds to stimulation  Airway & Oxygen Therapy: Patient Spontanous Breathing and Patient connected to face mask oxygen  Post-op Assessment: Report given to RN, Post -op Vital signs reviewed and stable, and Patient moving all extremities  Post vital signs: Reviewed and stable  Last Vitals:  Vitals Value Taken Time  BP 147/88 03/22/23 0933  Temp    Pulse 66 03/22/23 0937  Resp 25 03/22/23 0937  SpO2 97 % 03/22/23 0937  Vitals shown include unvalidated device data.  Last Pain:  Vitals:   03/22/23 0631  TempSrc: Temporal  PainSc: 0-No pain         Complications: No notable events documented.

## 2023-03-22 NOTE — Discharge Instructions (Signed)
AMBULATORY SURGERY  DISCHARGE INSTRUCTIONS   The drugs that you were given will stay in your system until tomorrow so for the next 24 hours you should not:  Drive an automobile Make any legal decisions Drink any alcoholic beverage   You may resume regular meals tomorrow.  Today it is better to start with liquids and gradually work up to solid foods.  You may eat anything you prefer, but it is better to start with liquids, then soup and crackers, and gradually work up to solid foods.   Please notify your doctor immediately if you have any unusual bleeding, trouble breathing, redness and pain at the surgery site, drainage, fever, or pain not relieved by medication.       Please contact your physician with any problems or Same Day Surgery at [phone], Monday through Friday 6 am to 4 pm, or [facility] [place] Main number at [phone].  

## 2023-03-22 NOTE — Op Note (Signed)
Robotic assisted Laparoscopic Transabdominal Left Inguinal Hernia Repair with Mesh       Pre-operative Diagnosis:  Bilateral Inguinal Hernia   Post-operative Diagnosis: Left inguinal hernia   Procedure: Robotic assisted Laparoscopic  repair of left inguinal hernia(s)   Surgeon: Campbell Lerner, M.D., FACS   Anesthesia: GETA   Findings: Left  inguinal hernia, no  evidence of right sided hernia.         Procedure Details  The patient was seen again in the Holding Room. The benefits, complications, treatment options, and expected outcomes were discussed with the patient. The risks of bleeding, infection, recurrence of symptoms, failure to resolve symptoms, recurrence of hernia, ischemic orchitis, chronic pain syndrome or neuroma, were reviewed again. The likelihood of improving the patient's symptoms with return to their baseline status is good.  The patient and/or family concurred with the proposed plan, giving informed consent.  The patient was taken to Operating Room, identified  and the procedure verified as Laparoscopic Inguinal Hernia Repair. Laterality confirmed.  A Time Out was held and the above information confirmed.   Prior to the induction of general anesthesia, antibiotic prophylaxis was administered. VTE prophylaxis was in place. General endotracheal anesthesia was then administered and tolerated well. After the induction, the abdomen was prepped with Chloraprep and draped in the sterile fashion. The patient was positioned in the supine position.   After local infiltration of quarter percent Marcaine with epinephrine, stab incision was made left upper quadrant.  On the left at Palmer's point, the Veress needle is passed with sensation of the layers to penetrate the abdominal wall and into the peritoneum.  Saline drop test is confirmed peritoneal placement.  Insufflation is initiated with carbon dioxide to pressures of 15 mmHg. An 8.5 mm port is placed to the left off of the midline,  with blunt tipped trocar.  Pneumoperitoneum maintained w/o HD changes to pressures of 15 mm Hg with CO2. No evidence of bowel injuries.  Two 8.5 mm ports placed under direct vision in each upper quadrant. The laparoscopy revealed a left sided indirect defect(s).   The robot was brought ot the table and docked in the standard fashion, no collision between arms was observed. Instruments were kept under direct view at all times. For left inguinal hernia repair,  I developed a peritoneal flap. The sac(s) were reduced and dissected free from adjacent structures. We preserved the vas and the vessels, and visualized them to their convergence and beyond in the retroperitoneum. Once dissection was completed an Extra-large left sided BARD 3D Light mesh was placed and secured at three points with interrupted 2-0 Vicryl to the pubic tubercle and anteriorly. There was good coverage of the direct, indirect and femoral spaces. Second look revealed no complications or injuries. The flap was then closed with 3-0 V-lock suture.  Peritoneal closure without defects.  Once assuring that hemostasis was adequate, all needles/sponges removed, and the robot was undocked.  Under direct visualization I placed the Veress needle into the preperitoneal space the Veress' valve was released allowing most of the extraperitoneal CO2 to escape.  The ports were removed, the abdomen desulflated.  4-0 subcuticular Monocryl was used at all skin edges. Dermabond was placed.  Patient tolerated the procedure well. There were no complications. He was taken to the recovery room in stable condition.           Campbell Lerner, M.D., FACS 03/22/2023, 10:20 AM

## 2023-03-22 NOTE — Anesthesia Postprocedure Evaluation (Signed)
Anesthesia Post Note  Patient: Calvin Wheeler  Procedure(s) Performed: XI ROBOTIC ASSISTED LEFT INGUINAL HERNIA (Left) INSERTION OF MESH  Patient location during evaluation: PACU Anesthesia Type: General Level of consciousness: awake and alert Pain management: pain level controlled Vital Signs Assessment: post-procedure vital signs reviewed and stable Respiratory status: spontaneous breathing, nonlabored ventilation, respiratory function stable and patient connected to nasal cannula oxygen Cardiovascular status: blood pressure returned to baseline and stable Postop Assessment: no apparent nausea or vomiting Anesthetic complications: no   No notable events documented.   Last Vitals:  Vitals:   03/22/23 1025 03/22/23 1037  BP: 138/79 (!) 142/80  Pulse: 60 73  Resp: 20 20  Temp:    SpO2: 98% 96%    Last Pain:  Vitals:   03/22/23 1037  TempSrc: Temporal  PainSc: 0-No pain                 Yevette Edwards

## 2023-03-22 NOTE — Anesthesia Preprocedure Evaluation (Signed)
Anesthesia Evaluation  Patient identified by MRN, date of birth, ID band Patient awake    Reviewed: Allergy & Precautions, H&P , NPO status , Patient's Chart, lab work & pertinent test results, reviewed documented beta blocker date and time   Airway Mallampati: II  TM Distance: >3 FB Neck ROM: full    Dental  (+) Teeth Intact   Pulmonary pneumonia, resolved, Patient abstained from smoking., former smoker   Pulmonary exam normal        Cardiovascular Exercise Tolerance: Good hypertension, On Medications negative cardio ROS Normal cardiovascular exam Rhythm:regular Rate:Normal     Neuro/Psych  PSYCHIATRIC DISORDERS Anxiety Depression    negative neurological ROS     GI/Hepatic negative GI ROS, Neg liver ROS,,,  Endo/Other  negative endocrine ROSdiabetes, Well Controlled    Renal/GU negative Renal ROS  negative genitourinary   Musculoskeletal   Abdominal   Peds  Hematology negative hematology ROS (+)   Anesthesia Other Findings Past Medical History: No date: Anxiety No date: Arthritis No date: Depression No date: Diabetes (HCC) No date: Hypertension No date: Pneumonia Past Surgical History: 11/03/2018: COLONOSCOPY WITH PROPOFOL; N/A     Comment:  Procedure: COLONOSCOPY WITH PROPOFOL;  Surgeon: Toney Reil, MD;  Location: [facility] ENDOSCOPY;  Service:               Gastroenterology;  Laterality: N/A; 11/04/2018: COLONOSCOPY WITH PROPOFOL; N/A     Comment:  Procedure: COLONOSCOPY WITH PROPOFOL;  Surgeon: Wyline Mood, MD;  Location: [facility] ENDOSCOPY;  Service:               Gastroenterology;  Laterality: N/A; No date: HERNIA REPAIR No date: MANDIBLE FRACTURE SURGERY     Comment:  around the age of 43   Reproductive/Obstetrics negative OB ROS                             Anesthesia Physical Anesthesia Plan  ASA: 2  Anesthesia Plan: General ETT    Post-op Pain Management:    Induction:   PONV Risk Score and Plan: 3  Airway Management Planned:   Additional Equipment:   Intra-op Plan:   Post-operative Plan:   Informed Consent: I have reviewed the patients History and Physical, chart, labs and discussed the procedure including the risks, benefits and alternatives for the proposed anesthesia with the patient or authorized representative who has indicated his/her understanding and acceptance.     Dental Advisory Given  Plan Discussed with: CRNA  Anesthesia Plan Comments:        Anesthesia Quick Evaluation

## 2023-03-22 NOTE — Anesthesia Procedure Notes (Cosign Needed)
Procedure Name: Intubation Date/Time: 03/22/2023 7:38 AM  Performed by: Yevette Edwards, MDPre-anesthesia Checklist: Patient identified, Emergency Drugs available, Suction available and Patient being monitored Patient Re-evaluated:Patient Re-evaluated prior to induction Oxygen Delivery Method: Circle system utilized Preoxygenation: Pre-oxygenation with 100% oxygen Induction Type: IV induction and Cricoid Pressure applied Ventilation: Mask ventilation without difficulty Laryngoscope Size: McGraph and 4 Grade View: Grade I Tube type: Oral Tube size: 7.5 mm Number of attempts: 1 Airway Equipment and Method: Stylet and Video-laryngoscopy Placement Confirmation: ETT inserted through vocal cords under direct vision, positive ETCO2 and breath sounds checked- equal and bilateral Secured at: 23 cm Tube secured with: Tape Dental Injury: Teeth and Oropharynx as per pre-operative assessment

## 2023-03-22 NOTE — Interval H&P Note (Signed)
History and Physical Interval Note:  03/22/2023 7:18 AM  Calvin Wheeler  has presented today for surgery, with the diagnosis of bilateral inguinal hernia.  The various methods of treatment have been discussed with the patient and family. After consideration of risks, benefits and other options for treatment, the patient has consented to  Procedure(s): XI ROBOTIC ASSISTED BILATERAL INGUINAL HERNIA (Bilateral) INSERTION OF MESH as a surgical intervention.  The patient's history has been reviewed, patient examined, no change in status, stable for surgery.  I have reviewed the patient's chart and labs.  Questions were answered to the patient's satisfaction.     Campbell Lerner

## 2023-03-23 ENCOUNTER — Encounter: Payer: Self-pay | Admitting: Surgery

## 2023-03-29 ENCOUNTER — Telehealth: Payer: Self-pay | Admitting: Urology

## 2023-05-05 ENCOUNTER — Other Ambulatory Visit: Payer: Self-pay | Admitting: Family Medicine

## 2023-05-06 NOTE — Telephone Encounter (Signed)
Requested Prescriptions  Pending Prescriptions Disp Refills   metFORMIN (GLUCOPHAGE-XR) 500 MG 24 hr tablet [Pharmacy Med Name: metFORMIN HCl ER 500 MG Oral Tablet Extended Release 24 Hour] 360 tablet 0    Sig: TAKE 2 TABLETS BY MOUTH TWICE DAILY WITH A MEAL     Endocrinology:  Diabetes - Biguanides Failed - 05/05/2023  4:37 PM      Failed - B12 Level in normal range and within 720 days    No results found for: "VITAMINB12"       Passed - Cr in normal range and within 360 days    Creatinine  Date Value Ref Range Status  12/27/2012 0.86 0.60 - 1.30 mg/dL Final   Creatinine, Ser  Date Value Ref Range Status  01/29/2023 0.97 0.76 - 1.27 mg/dL Final         Passed - HBA1C is between 0 and 7.9 and within 180 days    Hgb A1c MFr Bld  Date Value Ref Range Status  01/29/2023 6.4 (H) 4.8 - 5.6 % Final    Comment:             Prediabetes: 5.7 - 6.4          Diabetes: >6.4          Glycemic control for adults with diabetes: <7.0          Passed - eGFR in normal range and within 360 days    EGFR (African American)  Date Value Ref Range Status  12/27/2012 >60  Final   GFR calc Af Amer  Date Value Ref Range Status  02/13/2020 >60 >60 mL/min Final   EGFR (Non-African Amer.)  Date Value Ref Range Status  12/27/2012 >60  Final    Comment:    eGFR values <60mL/min/1.73 m2 may be an indication of chronic kidney disease (CKD). Calculated eGFR is useful in patients with stable renal function. The eGFR calculation will not be reliable in acutely ill patients when serum creatinine is changing rapidly. It is not useful in  patients on dialysis. The eGFR calculation may not be applicable to patients at the low and high extremes of body sizes, pregnant women, and vegetarians.    GFR, Estimated  Date Value Ref Range Status  08/13/2022 >60 >60 mL/min Final    Comment:    (NOTE) Calculated using the CKD-EPI Creatinine Equation (2021)    eGFR  Date Value Ref Range Status  01/29/2023  88 >59 mL/min/1.73 Final         Passed - Valid encounter within last 6 months    Recent Outpatient Visits           1 month ago GAD (generalized anxiety disorder)   [facility] Jacky Kindle, FNP   3 months ago Encounter for medical examination to establish care   [facility] Jacky Kindle, FNP       Future Appointments             In 4 days Jacky Kindle, FNP [facility]            Passed - CBC within normal limits and completed in the last 12 months    WBC  Date Value Ref Range Status  03/18/2023 8.9 4.0 - 10.5 K/uL Final   RBC  Date Value Ref Range Status  03/18/2023 4.67 4.22 - 5.81 MIL/uL Final   Hemoglobin  Date Value Ref Range Status  03/18/2023  13.3 13.0 - 17.0 g/dL Final   HGB  Date Value Ref Range Status  12/27/2012 13.1 13.0 - 18.0 g/dL Final   HCT  Date Value Ref Range Status  03/18/2023 41.4 39.0 - 52.0 % Final  12/27/2012 39.5 (L) 40.0 - 52.0 % Final   MCHC  Date Value Ref Range Status  03/18/2023 32.1 30.0 - 36.0 g/dL Final   [facility]  Date Value Ref Range Status  03/18/2023 28.5 26.0 - 34.0 pg Final   MCV  Date Value Ref Range Status  03/18/2023 88.7 80.0 - 100.0 fL Final  12/27/2012 89 80 - 100 fL Final   No results found for: "PLTCOUNTKUC", "LABPLAT", "POCPLA" RDW  Date Value Ref Range Status  03/18/2023 15.4 11.5 - 15.5 % Final  12/27/2012 15.1 (H) 11.5 - 14.5 % Final

## 2023-05-10 ENCOUNTER — Ambulatory Visit: Payer: Medicaid Other | Admitting: Family Medicine

## 2023-05-10 ENCOUNTER — Encounter: Payer: Self-pay | Admitting: Family Medicine

## 2023-05-10 VITALS — BP 116/77 | HR 70 | Temp 97.6°F | Ht 74.0 in | Wt 267.3 lb

## 2023-05-10 DIAGNOSIS — E782 Mixed hyperlipidemia: Secondary | ICD-10-CM

## 2023-05-10 DIAGNOSIS — R972 Elevated prostate specific antigen [PSA]: Secondary | ICD-10-CM | POA: Diagnosis not present

## 2023-05-10 DIAGNOSIS — F411 Generalized anxiety disorder: Secondary | ICD-10-CM | POA: Diagnosis not present

## 2023-05-10 DIAGNOSIS — E669 Obesity, unspecified: Secondary | ICD-10-CM

## 2023-05-10 DIAGNOSIS — Z8042 Family history of malignant neoplasm of prostate: Secondary | ICD-10-CM | POA: Diagnosis not present

## 2023-05-10 DIAGNOSIS — K909 Intestinal malabsorption, unspecified: Secondary | ICD-10-CM | POA: Diagnosis not present

## 2023-05-10 DIAGNOSIS — R7303 Prediabetes: Secondary | ICD-10-CM | POA: Diagnosis not present

## 2023-05-10 DIAGNOSIS — K760 Fatty (change of) liver, not elsewhere classified: Secondary | ICD-10-CM | POA: Diagnosis not present

## 2023-05-10 DIAGNOSIS — I10 Essential (primary) hypertension: Secondary | ICD-10-CM

## 2023-05-10 MED ORDER — AMLODIPINE BESYLATE 5 MG PO TABS
5.0000 mg | ORAL_TABLET | Freq: Every day | ORAL | 3 refills | Status: AC
Start: 2023-05-10 — End: 2023-08-08

## 2023-05-10 MED ORDER — FENOFIBRATE 145 MG PO TABS: 145.0000 mg | ORAL_TABLET | Freq: Every day | ORAL | 3 refills | Status: AC

## 2023-05-10 NOTE — Assessment & Plan Note (Signed)
Chronic, previously stable Repeat A1c and urine micro Remains on metformin 1000 mg BID Continue to recommend balanced, lower carb meals. Smaller meal size, adding snacks. Choosing water as drink of choice and increasing purposeful exercise.

## 2023-05-10 NOTE — Progress Notes (Signed)
Established patient visit   Patient: Calvin Wheeler   DOB: [birth year]   62 y.o. Male  MRN: [id number] Visit Date: 05/10/2023  Today's healthcare provider: Jacky Kindle, FNP  Introduced to nurse practitioner role and practice setting.  All questions answered.  Discussed provider/patient relationship and expectations.  Subjective    HPI HPI     Medical Management of Chronic Issues    Additional comments: 8 week follow up for anxiety and depression, no concerns today      Last edited by Rolly Salter, CMA on 05/10/2023 10:05 AM.      Medications: Outpatient Medications Prior to Visit  Medication Sig   albuterol (VENTOLIN HFA) 108 (90 Base) MCG/ACT inhaler Inhale 2 puffs into the lungs every 6 (six) hours as needed for wheezing or shortness of breath.   citalopram (CELEXA) 40 MG tablet Take 1 tablet (40 mg total) by mouth daily.   clonazePAM (KLONOPIN) 0.5 MG tablet Take 0.5 tablets (0.25 mg total) by mouth 2 (two) times daily as needed for anxiety.   HYDROcodone-acetaminophen (NORCO/VICODIN) 5-325 MG tablet Take 1 tablet by mouth every 6 (six) hours as needed for moderate pain.   ibuprofen (ADVIL) 800 MG tablet Take 1 tablet (800 mg total) by mouth every 8 (eight) hours as needed.   metFORMIN (GLUCOPHAGE-XR) 500 MG 24 hr tablet TAKE 2 TABLETS BY MOUTH TWICE DAILY WITH A MEAL   rosuvastatin (CRESTOR) 20 MG tablet Take 1 tablet (20 mg total) by mouth daily.   [DISCONTINUED] fenofibrate (TRICOR) 145 MG tablet Take 1 tablet (145 mg total) by mouth daily.   [DISCONTINUED] amLODipine (NORVASC) 5 MG tablet Take 1 tablet (5 mg total) by mouth daily.   No facility-administered medications prior to visit.    Review of Systems  Last CBC Lab Results  Component Value Date   WBC 8.9 03/18/2023   HGB 13.3 03/18/2023   HCT 41.4 03/18/2023   MCV 88.7 03/18/2023   MCH 28.5 03/18/2023   RDW 15.4 03/18/2023   PLT 328 03/18/2023   Last metabolic panel Lab Results  Component Value  Date   GLUCOSE 103 (H) 01/29/2023   NA 142 01/29/2023   K 4.3 01/29/2023   CL 103 01/29/2023   CO2 22 01/29/2023   BUN 10 01/29/2023   CREATININE 0.97 01/29/2023   EGFR 88 01/29/2023   CALCIUM 9.7 01/29/2023   PROT 7.2 01/29/2023   ALBUMIN 4.4 01/29/2023   LABGLOB 2.8 01/29/2023   AGRATIO 1.6 01/29/2023   BILITOT <0.2 01/29/2023   ALKPHOS 94 01/29/2023   AST 17 01/29/2023   ALT 19 01/29/2023   ANIONGAP 8 08/13/2022   Last lipids Lab Results  Component Value Date   CHOL 145 01/29/2023   HDL 29 (L) 01/29/2023   LDLCALC 85 01/29/2023   TRIG 179 (H) 01/29/2023   CHOLHDL 5.0 01/29/2023   Last hemoglobin A1c Lab Results  Component Value Date   HGBA1C 6.4 (H) 01/29/2023        Objective    BP 116/77 (BP Location: Left Arm, Patient Position: Sitting, Cuff Size: Large)   Pulse 70   Temp 97.6 F (36.4 C) (Oral)   Ht 6\' 2"  (1.88 m)   Wt 267 lb 4.8 oz (121.2 kg)   SpO2 99%   BMI 34.32 kg/m   BP Readings from Last 3 Encounters:  05/10/23 116/77  03/22/23 (!) 142/80  03/12/23 130/85   Wt Readings from Last 3 Encounters:  05/10/23 267 lb 4.8 oz (  121.2 kg)  03/12/23 282 lb (127.9 kg)  03/11/23 280 lb (127 kg)      Physical Exam Vitals and nursing note reviewed.  Constitutional:      Appearance: Normal appearance. He is obese.  HENT:     Head: Normocephalic and atraumatic.  Cardiovascular:     Rate and Rhythm: Normal rate and regular rhythm.     Pulses: Normal pulses.     Heart sounds: Normal heart sounds.  Pulmonary:     Effort: Pulmonary effort is normal.     Breath sounds: Normal breath sounds.  Musculoskeletal:        General: Normal range of motion.     Cervical back: Normal range of motion.  Skin:    General: Skin is warm and dry.     Capillary Refill: Capillary refill takes less than 2 seconds.  Neurological:     General: No focal deficit present.     Mental Status: He is alert and oriented to person, place, and time. Mental status is at  baseline.  Psychiatric:        Mood and Affect: Mood normal.        Behavior: Behavior normal.        Thought Content: Thought content normal.        Judgment: Judgment normal.     No results found for any visits on 05/10/23.  Assessment & Plan     Problem List Items Addressed This Visit       Cardiovascular and Mediastinum   Essential hypertension    Chronic, improved At goal Continue norvasc 5 mg daily      Relevant Medications   amLODipine (NORVASC) 5 MG tablet   fenofibrate (TRICOR) 145 MG tablet   Other Relevant Orders   Comprehensive metabolic panel   CBC with Differential/Platelet   TSH     Digestive   Intestinal malabsorption    Patient complaints of pills/medication appearing whole in stool Repeat labs Discussed plan to follow up with GI if needed      Metabolic dysfunction-associated steatotic liver disease (MASLD)    Chronic, weight improved Body mass index is 34.32 kg/m. Repeat CMP and LP       Relevant Orders   Comprehensive metabolic panel     Other   Elevated PSA    Previous PSA elevated x2; <10 Repeat labs       Relevant Orders   PSA   Family history of prostate cancer    Pt declines to f/u with GU at this time Repeat PSA      GAD (generalized anxiety disorder) - Primary    Chronic, improved Remains on celexa 40 mg Has PRN klonopin if needed Pleased that hernia is resolved; wants to wait to f/u with GU for elevated PSA Repeat PSA a this time with [facility]; no complaints       Mixed hyperlipidemia    Chronic, repeat LP Known MASLD as well recommend diet low in saturated fat and regular exercise - 30 min at least 5 times per week Remains on crestor 20      Relevant Medications   amLODipine (NORVASC) 5 MG tablet   fenofibrate (TRICOR) 145 MG tablet   Other Relevant Orders   Lipid panel   Obesity (BMI 30.0-34.9)    Chronic, improved Body mass index is 34.32 kg/m. Discussed importance of healthy weight management Discussed  diet and exercise       Prediabetes    Chronic, previously stable Repeat A1c  and urine micro Remains on metformin 1000 mg BID Continue to recommend balanced, lower carb meals. Smaller meal size, adding snacks. Choosing water as drink of choice and increasing purposeful exercise.      Relevant Orders   Urine Microalbumin w/creat. ratio   Hemoglobin A1c   Return in about 6 months (around 11/10/2023), or if symptoms worsen or fail to improve, for chonic disease management.     Leilani Merl, FNP, have reviewed all documentation for this visit. The documentation on 05/10/23 for the exam, diagnosis, procedures, and orders are all accurate and complete.  Jacky Kindle, FNP  [facility] [phone] (phone) [phone] (fax)  [facility]

## 2023-05-10 NOTE — Assessment & Plan Note (Signed)
Chronic, improved At goal Continue norvasc 5 mg daily

## 2023-05-10 NOTE — Assessment & Plan Note (Signed)
Pt declines to f/u with GU at this time Repeat PSA

## 2023-05-10 NOTE — Assessment & Plan Note (Signed)
Chronic, improved Body mass index is 34.32 kg/m. Discussed importance of healthy weight management Discussed diet and exercise

## 2023-05-10 NOTE — Assessment & Plan Note (Signed)
Chronic, weight improved Body mass index is 34.32 kg/m. Repeat CMP and LP

## 2023-05-10 NOTE — Assessment & Plan Note (Signed)
Chronic, improved Remains on celexa 40 mg Has PRN klonopin if needed Pleased that hernia is resolved; wants to wait to f/u with GU for elevated PSA Repeat PSA a this time with [facility]; no complaints

## 2023-05-10 NOTE — Assessment & Plan Note (Signed)
Previous PSA elevated x2; <10 Repeat labs

## 2023-05-10 NOTE — Assessment & Plan Note (Signed)
Patient complaints of pills/medication appearing whole in stool Repeat labs Discussed plan to follow up with GI if needed

## 2023-05-10 NOTE — Assessment & Plan Note (Signed)
Chronic, repeat LP Known MASLD as well recommend diet low in saturated fat and regular exercise - 30 min at least 5 times per week Remains on crestor 20

## 2023-05-11 NOTE — Progress Notes (Signed)
Please follow up with urology. Cholesterol is increased. The 10-year ASCVD risk score (Arnett DK, et al., 2019) is: 23.1%.  Slight anemia noted on cell count as well; continue to monitor for symptoms/signs of bleeding or bruising. Urine and A1c pending

## 2023-05-11 NOTE — Progress Notes (Signed)
A1c and urine stable

## 2023-05-17 ENCOUNTER — Telehealth: Payer: Self-pay | Admitting: Family Medicine

## 2023-05-17 NOTE — Telephone Encounter (Signed)
Referral Request - Has patient seen PCP for this complaint? yes *If NO, is insurance requiring patient see PCP for this issue before PCP can refer them? Referral for which specialty: urologist Preferred provider/office: ? Reason for referral: ?

## 2023-05-18 NOTE — Telephone Encounter (Signed)
Patient advised that he needs to call their office to make his follow up appointment

## 2023-06-20 ENCOUNTER — Telehealth: Payer: Self-pay | Admitting: Urology

## 2023-06-20 NOTE — Telephone Encounter (Signed)
Patient seen June 2024 for an elevated PSA and prostate MRI recommended which she refused to schedule.  Follow-up PSA by PCP August 2024 has increased.  Recommend follow-up office visit to discuss options.  His rising PSA is concerning for possible prostate cancer.

## 2023-06-21 NOTE — Telephone Encounter (Signed)
Patient states he would think over coming into the office and discussing next step. He states he will call back . We also tagged Suzie Portela FNP about the rasing PSA

## 2023-07-22 NOTE — Telephone Encounter (Signed)
error 

## 2023-08-05 ENCOUNTER — Other Ambulatory Visit: Payer: Self-pay | Admitting: Family Medicine

## 2023-11-04 ENCOUNTER — Other Ambulatory Visit: Payer: Self-pay

## 2023-11-04 ENCOUNTER — Telehealth: Payer: Self-pay | Admitting: Family Medicine

## 2023-11-04 MED ORDER — METFORMIN HCL ER 500 MG PO TB24: 1000.0000 mg | ORAL_TABLET | Freq: Two times a day (BID) | ORAL | 0 refills | Status: AC

## 2023-11-04 NOTE — Telephone Encounter (Signed)
[facility] faxed refill request for the following medications:  metFORMIN (GLUCOPHAGE-XR) 500 MG 24 hr tablet   Please advise.

## 2024-01-03 ENCOUNTER — Other Ambulatory Visit: Payer: Self-pay | Admitting: Family Medicine

## 2024-01-05 ENCOUNTER — Other Ambulatory Visit: Payer: Self-pay | Admitting: Family Medicine
